# Patient Record
Sex: Male | Born: 1980 | Race: White | Hispanic: No | Marital: Married | Smoking: Current some day smoker
Health system: Southern US, Community
[De-identification: ages and names within clinical notes are randomized; demographics above are authoritative.]

## PROBLEM LIST (undated history)

## (undated) DIAGNOSIS — E119 Type 2 diabetes mellitus without complications: Secondary | ICD-10-CM

## (undated) DIAGNOSIS — J45909 Unspecified asthma, uncomplicated: Secondary | ICD-10-CM

## (undated) DIAGNOSIS — E785 Hyperlipidemia, unspecified: Secondary | ICD-10-CM

## (undated) HISTORY — DX: Hyperlipidemia, unspecified: E78.5

## (undated) HISTORY — DX: Type 2 diabetes mellitus without complications: E11.9

---
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
## 2007-03-27 ENCOUNTER — Ambulatory Visit (HOSPITAL_COMMUNITY): Admission: RE | Admit: 2007-03-27 | Discharge: 2007-03-27 | Payer: Self-pay | Admitting: Family Medicine

## 2010-12-26 ENCOUNTER — Emergency Department (HOSPITAL_COMMUNITY)
Admission: EM | Admit: 2010-12-26 | Discharge: 2010-12-26 | Disposition: A | Discharge status: Home / Self Care | Payer: Self-pay | Attending: Emergency Medicine | Admitting: Emergency Medicine

## 2010-12-26 DIAGNOSIS — J45909 Unspecified asthma, uncomplicated: Secondary | ICD-10-CM | POA: Insufficient documentation

## 2016-01-08 ENCOUNTER — Encounter (HOSPITAL_COMMUNITY): Payer: Self-pay | Admitting: Emergency Medicine

## 2016-01-08 ENCOUNTER — Emergency Department (HOSPITAL_COMMUNITY): Payer: BLUE CROSS/BLUE SHIELD

## 2016-01-08 ENCOUNTER — Emergency Department (HOSPITAL_COMMUNITY)
Admission: EM | Admit: 2016-01-08 | Discharge: 2016-01-08 | Disposition: A | Discharge status: Home / Self Care | Payer: BLUE CROSS/BLUE SHIELD | Attending: Emergency Medicine | Admitting: Emergency Medicine

## 2016-01-08 DIAGNOSIS — Z6841 Body Mass Index (BMI) 40.0 and over, adult: Secondary | ICD-10-CM | POA: Insufficient documentation

## 2016-01-08 DIAGNOSIS — Y929 Unspecified place or not applicable: Secondary | ICD-10-CM | POA: Diagnosis not present

## 2016-01-08 DIAGNOSIS — J45909 Unspecified asthma, uncomplicated: Secondary | ICD-10-CM | POA: Diagnosis not present

## 2016-01-08 DIAGNOSIS — S20221A Contusion of right back wall of thorax, initial encounter: Secondary | ICD-10-CM | POA: Insufficient documentation

## 2016-01-08 DIAGNOSIS — Y939 Activity, unspecified: Secondary | ICD-10-CM | POA: Diagnosis not present

## 2016-01-08 DIAGNOSIS — W108XXA Fall (on) (from) other stairs and steps, initial encounter: Secondary | ICD-10-CM | POA: Diagnosis not present

## 2016-01-08 DIAGNOSIS — Z79899 Other long term (current) drug therapy: Secondary | ICD-10-CM | POA: Diagnosis not present

## 2016-01-08 DIAGNOSIS — Y999 Unspecified external cause status: Secondary | ICD-10-CM | POA: Insufficient documentation

## 2016-01-08 DIAGNOSIS — F17200 Nicotine dependence, unspecified, uncomplicated: Secondary | ICD-10-CM | POA: Diagnosis not present

## 2016-01-08 DIAGNOSIS — S299XXA Unspecified injury of thorax, initial encounter: Secondary | ICD-10-CM | POA: Diagnosis present

## 2016-01-08 DIAGNOSIS — S20211A Contusion of right front wall of thorax, initial encounter: Secondary | ICD-10-CM

## 2016-01-08 HISTORY — DX: Unspecified asthma, uncomplicated: J45.909

## 2016-01-08 MED ORDER — DICLOFENAC SODIUM 50 MG PO TBEC: 50.0000 mg | DELAYED_RELEASE_TABLET | Freq: Two times a day (BID) | ORAL | Status: DC

## 2016-01-08 MED ORDER — METHOCARBAMOL 500 MG PO TABS: 500.0000 mg | ORAL_TABLET | Freq: Four times a day (QID) | ORAL | Status: DC

## 2016-01-08 NOTE — ED Notes (Signed)
Pt c/o lower back and right middle back/rib pain after slipping and falling on steps at 1440 this afternoon.

## 2016-01-08 NOTE — Discharge Instructions (Signed)
Chest Contusion A chest contusion is a deep bruise on your chest area. Contusions are the result of an injury that caused bleeding under the skin. A chest contusion may involve bruising of the skin, muscles, or ribs. The contusion may turn blue, purple, or yellow. Minor injuries will give you a painless contusion, but more severe contusions may stay painful and swollen for a few weeks. CAUSES  A contusion is usually caused by a blow, trauma, or direct force to an area of the body. SYMPTOMS   Swelling and redness of the injured area.  Discoloration of the injured area.  Tenderness and soreness of the injured area.  Pain. DIAGNOSIS  The diagnosis can be made by taking a history and performing a physical exam. An X-ray, CT scan, or MRI may be needed to determine if there were any associated injuries, such as broken bones (fractures) or internal injuries. TREATMENT  Often, the best treatment for a chest contusion is resting, icing, and applying cold compresses to the injured area. Deep breathing exercises may be recommended to reduce the risk of pneumonia. Over-the-counter medicines may also be recommended for pain control. HOME CARE INSTRUCTIONS   Put ice on the injured area.  Put ice in a plastic bag.  Place a towel between your skin and the bag.  Leave the ice on for 15-20 minutes, 03-04 times a day.  Only take over-the-counter or prescription medicines as directed by your caregiver. Your caregiver may recommend avoiding anti-inflammatory medicines (aspirin, ibuprofen, and naproxen) for 48 hours because these medicines may increase bruising.  Rest the injured area.  Perform deep-breathing exercises as directed by your caregiver.  Stop smoking if you smoke.  Do not lift objects over 5 pounds (2.3 kg) for 3 days or longer if recommended by your caregiver. SEEK IMMEDIATE MEDICAL CARE IF:   You have increased bruising or swelling.  You have pain that is getting worse.  You have  difficulty breathing.  You have dizziness, weakness, or fainting.  You have blood in your urine or stool.  You cough up or vomit blood.  Your swelling or pain is not relieved with medicines. MAKE SURE YOU:   Understand these instructions.  Will watch your condition.  Will get help right away if you are not doing well or get worse.   This information is not intended to replace advice given to you by your health care provider. Make sure you discuss any questions you have with your health care provider.   Document Released: 04/16/2001 Document Revised: 04/15/2012 Document Reviewed: 01/13/2012 Elsevier Interactive Patient Education 2016 [employer]  

## 2016-01-08 NOTE — ED Provider Notes (Signed)
History  By signing my name below, I, Earmon PhoenixJennifer Waddell, attest that this documentation has been prepared under the direction and in the presence of Langston MaskerKaren Sofia, [place]-C. Electronically Signed: Earmon PhoenixJennifer Waddell, ED Scribe. 01/08/2016. 6:23 PM.  Chief Complaint  Patient presents with  . Fall   The history is provided by the patient and medical records. No language interpreter was used.    HPI Comments:  Tony Terrell is a 35 y.o. morbidly obese male who presents to the Emergency Department complaining of a fall that occurred about three hours ago. Pt states he slipped and fell down two stairs, causing the last stair to crack. He reports some right sided lower back pain and right rib pain. He has not taken anything for pain. Movement and walking increase the pain. He denies alleviating factors. He denies head trauma, LOC, nausea, vomiting, abdominal pain, bruising or wounds. He is ambulatory without difficulty or assistance.  Past Medical History  Diagnosis Date  . Asthma    History reviewed. No pertinent past surgical history. No family history on file. Social History  Substance Use Topics  . Smoking status: Current Some Day Smoker  . Smokeless tobacco: None  . Alcohol Use: Yes     Comment: rare    Review of Systems  Musculoskeletal: Positive for back pain and arthralgias.  All other systems reviewed and are negative.   Allergies  Review of patient's allergies indicates no known allergies.  Home Medications   Prior to Admission medications   Medication Sig Start Date End Date Taking? Authorizing Provider  albuterol (PROVENTIL HFA;VENTOLIN HFA) 108 (90 Base) MCG/ACT inhaler Inhale 1 puff into the lungs every 6 (six) hours as needed for wheezing or shortness of breath.   Yes Historical Provider, MD   Triage Vitals: BP 133/65 mmHg  Pulse 98  Temp(Src) 98.9 F (37.2 C)  Resp 18  Ht 6\' 1"  (1.854 m)  Wt 355 lb (161.027 kg)  BMI 46.85 kg/m2  SpO2 98% Physical Exam   Constitutional: He is oriented to person, place, and time. He appears well-developed and well-nourished.  HENT:  Head: Normocephalic and atraumatic.  Eyes: EOM are normal.  Neck: Normal range of motion.  Cardiovascular: Normal rate.   Pulmonary/Chest: Effort normal.  Musculoskeletal: Normal range of motion. He exhibits tenderness.  Tender to L-3, L-4 and L-5 region. Tender to anterior right ribs.  Neurological: He is alert and oriented to person, place, and time.  Skin: Skin is warm and dry.  Psychiatric: He has a normal mood and affect. His behavior is normal.  Nursing note and vitals reviewed.   ED Course  Procedures (including critical care time) DIAGNOSTIC STUDIES: Oxygen Saturation is 98% on RA, normal by my interpretation.   COORDINATION OF CARE: 5:47 PM- Will order CXR and L-Spine. Offered pain medication but pt declined at this time. Pt verbalizes understanding and agrees to plan.  Medications - No data to display  Labs Review Labs Reviewed - No data to display  Imaging Review Dg Chest 2 View  01/08/2016  CLINICAL DATA:  Fall down stairs with chest pain and shortness of breath, initial encounter EXAM: CHEST  2 VIEW COMPARISON:  03/27/2007 FINDINGS: The heart size and mediastinal contours are within normal limits. Both lungs are clear. The visualized skeletal structures are unremarkable. IMPRESSION: No active cardiopulmonary disease. Electronically Signed   By: Alcide CleverMark  Lukens M.D.   On: 01/08/2016 18:06   Dg Lumbar Spine Complete  01/08/2016  CLINICAL DATA:  Fall down stairs  with right low back pain. EXAM: LUMBAR SPINE - COMPLETE 4+ VIEW COMPARISON:  None. FINDINGS: This report assumes 5 non rib-bearing lumbar vertebrae. Lumbar vertebral body heights are preserved, with no fracture. Lumbar disc heights are preserved. No spondylosis. No spondylolisthesis. No appreciable facet arthropathy. No aggressive appearing focal osseous lesions. IMPRESSION: No lumbar spine fracture or  spondylolisthesis. Electronically Signed   By: Delbert Phenix M.D.   On: 01/08/2016 18:06   I have personally reviewed and evaluated these images and lab results as part of my medical decision-making.   EKG Interpretation None      MDM   Final diagnoses:  Contusion of ribs, right, initial encounter  Contusion, back, right, initial encounter    Meds ordered this encounter  Medications  . albuterol (PROVENTIL HFA;VENTOLIN HFA) 108 (90 Base) MCG/ACT inhaler    Sig: Inhale 1 puff into the lungs every 6 (six) hours as needed for wheezing or shortness of breath.  . methocarbamol (ROBAXIN) 500 MG tablet    Sig: Take 1 tablet (500 mg total) by mouth 4 (four) times daily.    Dispense:  28 tablet    Refill:  0    Order Specific Question:  Supervising Provider    Answer:  MILLER, BRIAN [[id number]]  . diclofenac (VOLTAREN) 50 MG EC tablet    Sig: Take 1 tablet (50 mg total) by mouth 2 (two) times daily.    Dispense:  14 tablet    Refill:  0    Order Specific Question:  Supervising Provider    Answer:  Eber Hong [[id number]]   I personally performed the services in this documentation, which was scribed in my presence.  The recorded information has been reviewed and considered.   Barnet Pall.   Lonia Skinner [place], PA-C 01/08/16 2025  Bethann Berkshire, MD 01/11/16 [phone]

## 2017-04-11 IMAGING — DX DG CHEST 2V
2 series · 2 of 2 positions shown · non-contrast
Comparison: 03/27/2007

CLINICAL DATA: Fall down stairs with chest pain and shortness of
breath, initial encounter

EXAM:
CHEST  2 VIEW

[chest pa]
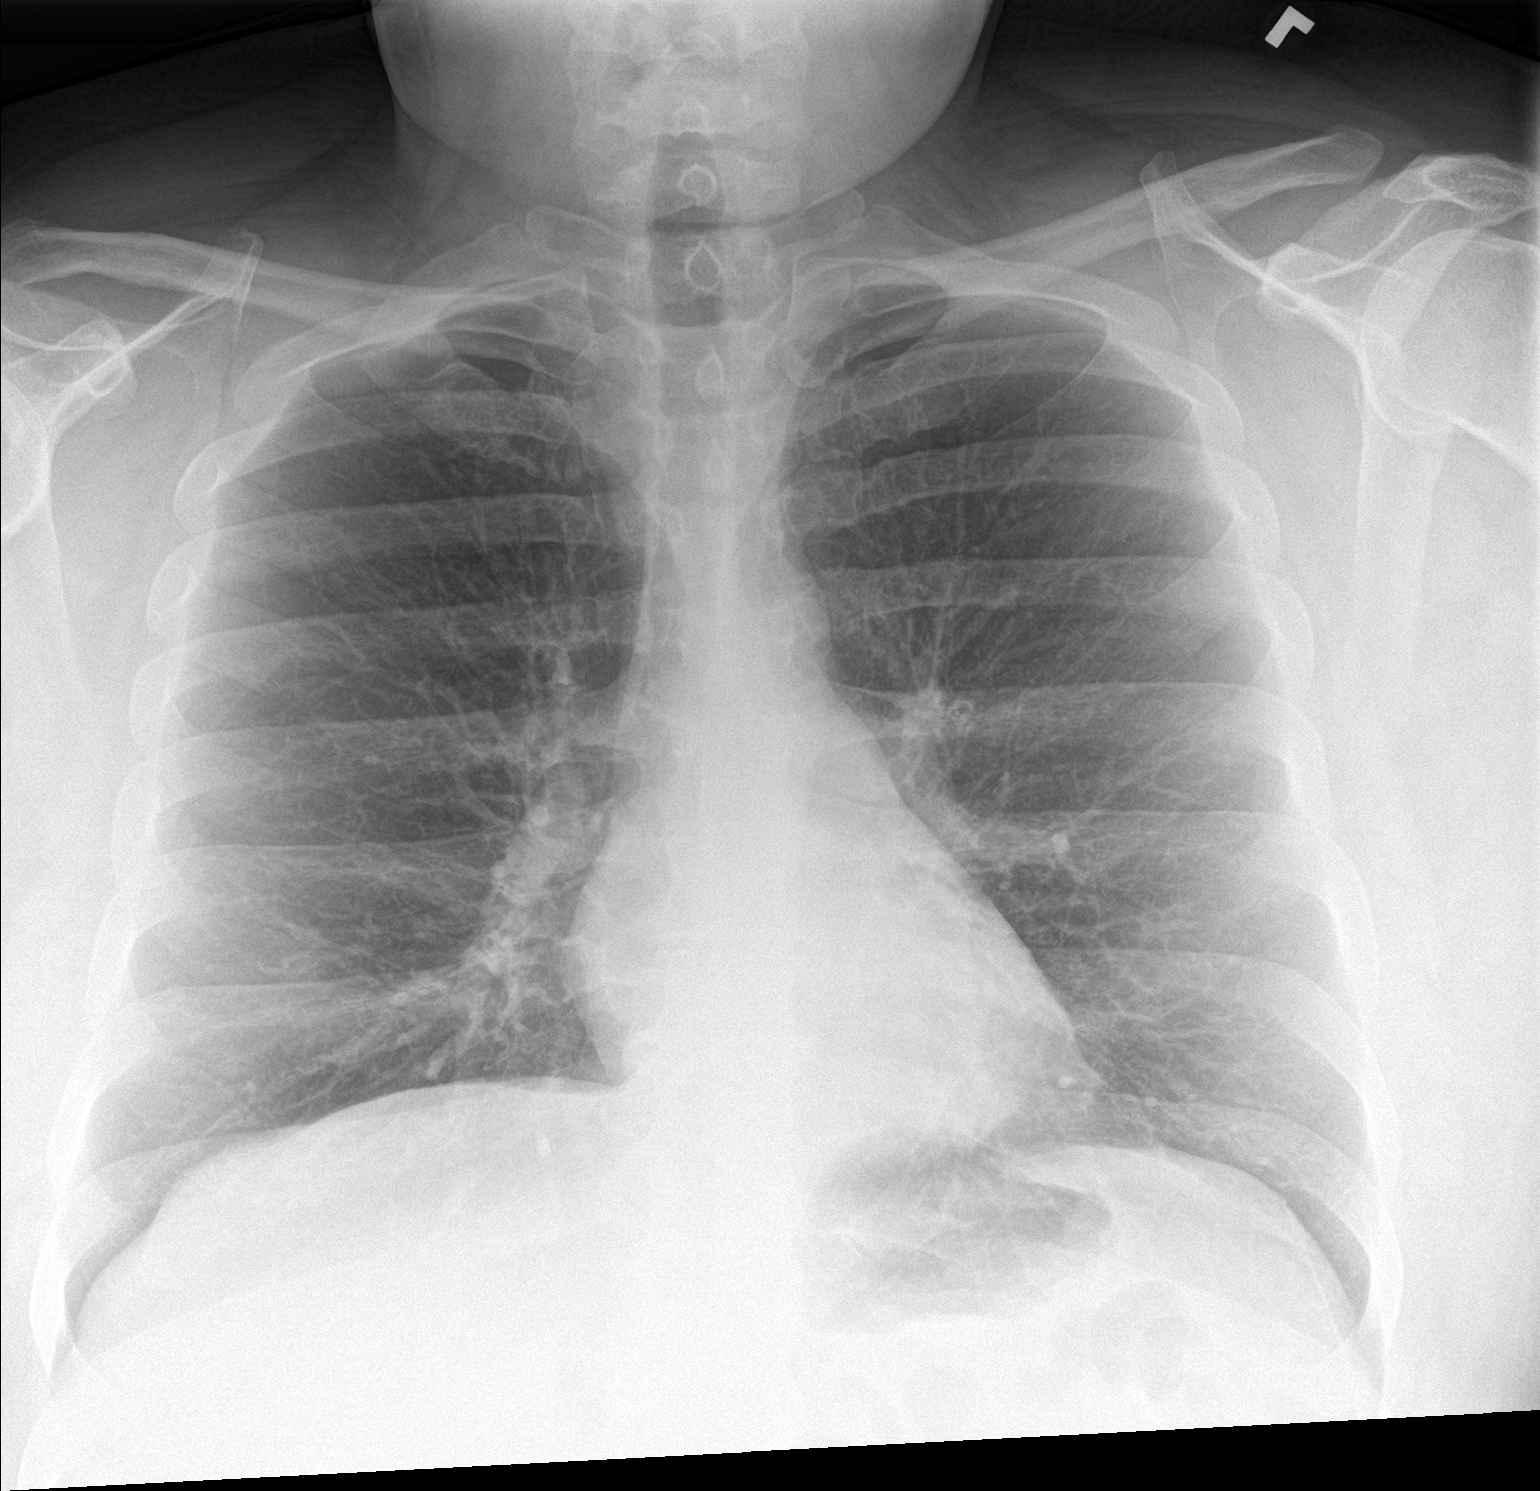

[chest lat]
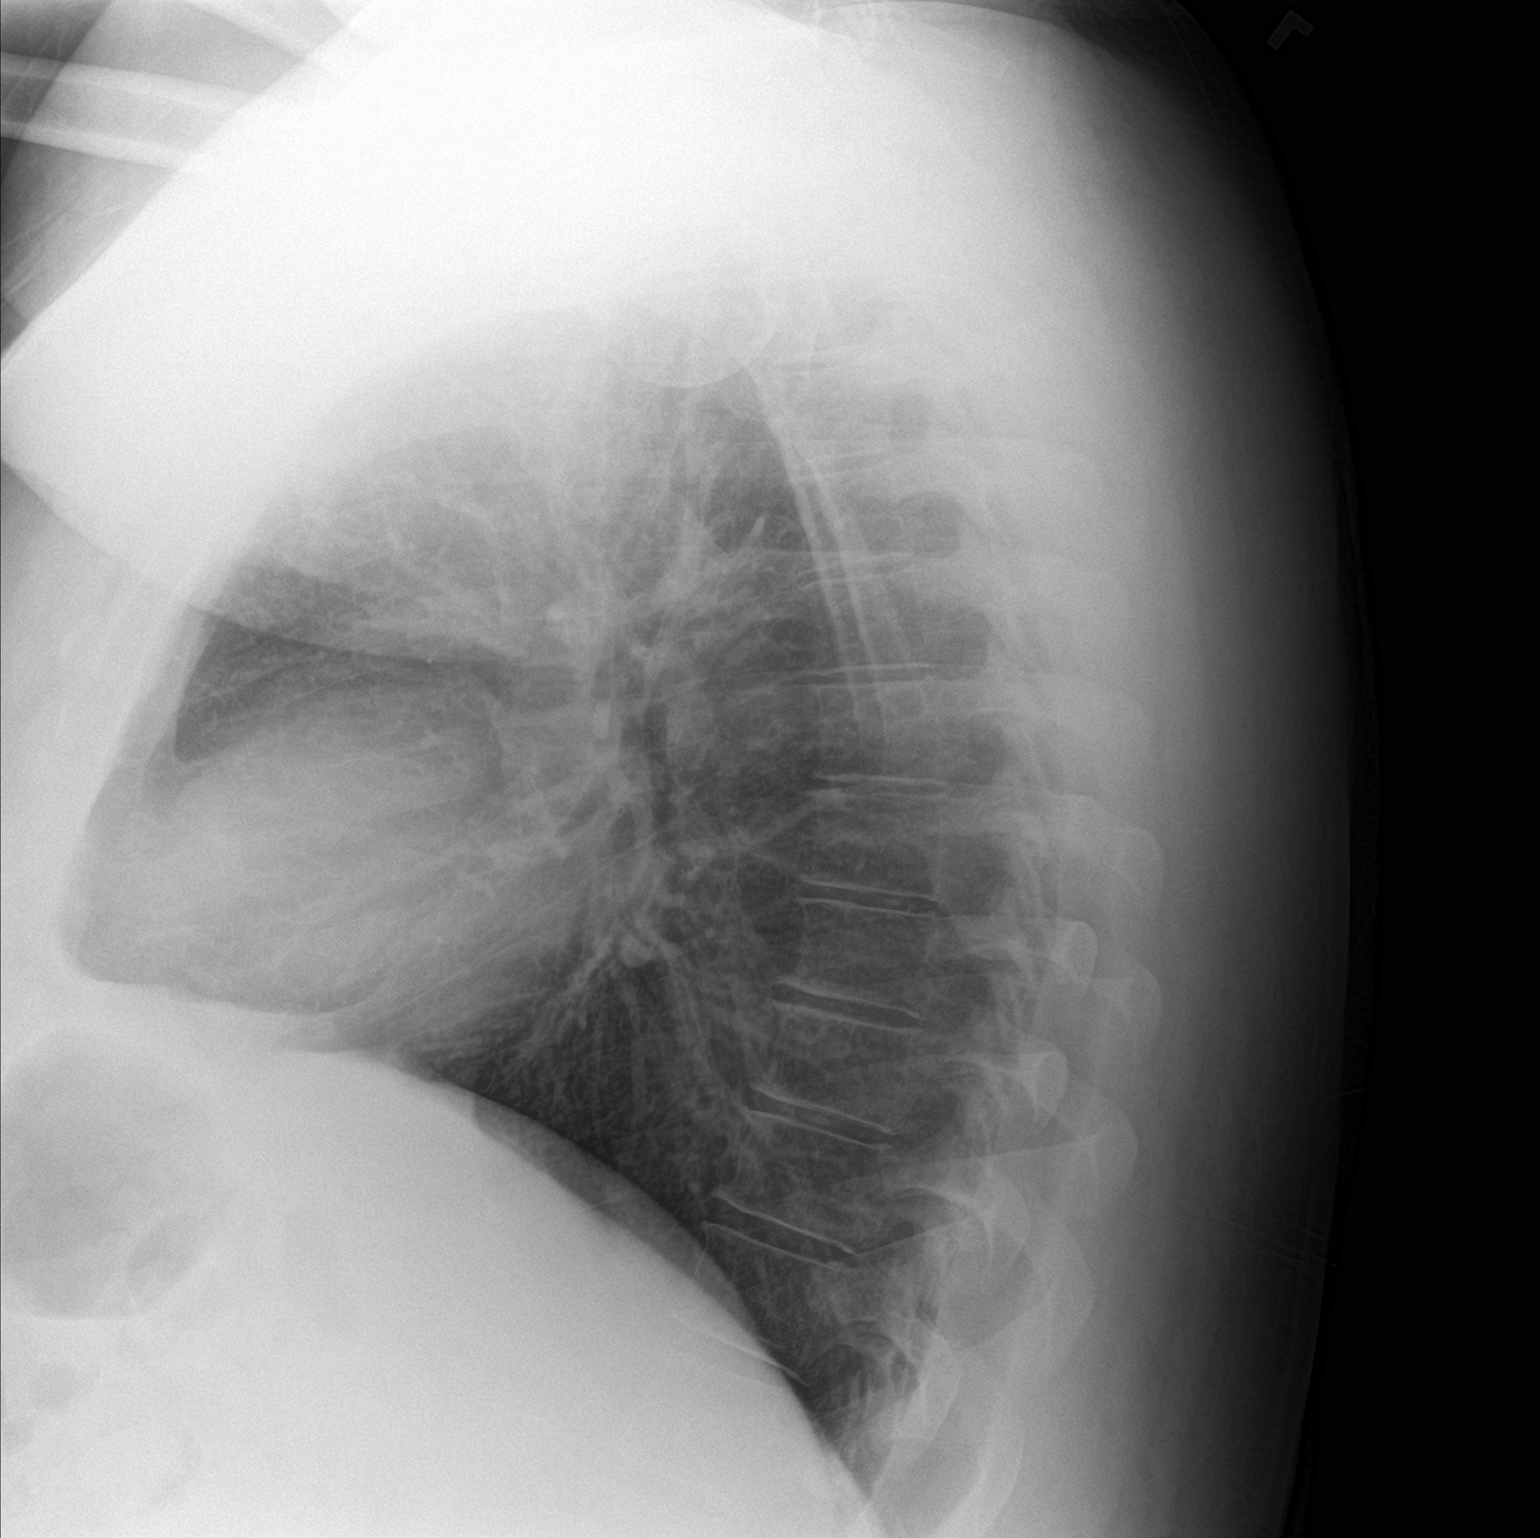

[2 of 2 positions shown; findings below may reference images not displayed]

FINDINGS: The heart size and mediastinal contours are within normal limits.
Both lungs are clear. The visualized skeletal structures are
unremarkable.
IMPRESSION: No active cardiopulmonary disease.

## 2021-07-17 ENCOUNTER — Ambulatory Visit: Payer: BLUE CROSS/BLUE SHIELD | Admitting: Nurse Practitioner

## 2021-07-31 ENCOUNTER — Ambulatory Visit: Payer: BLUE CROSS/BLUE SHIELD | Admitting: Nurse Practitioner

## 2021-08-06 ENCOUNTER — Other Ambulatory Visit: Payer: Self-pay

## 2021-08-06 ENCOUNTER — Encounter: Payer: Self-pay | Admitting: Nurse Practitioner

## 2021-08-06 ENCOUNTER — Ambulatory Visit: Payer: 59 | Admitting: Nurse Practitioner

## 2021-08-06 VITALS — BP 121/82 | HR 93 | Ht 72.0 in | Wt 348.0 lb

## 2021-08-06 DIAGNOSIS — N529 Male erectile dysfunction, unspecified: Secondary | ICD-10-CM

## 2021-08-06 DIAGNOSIS — Z139 Encounter for screening, unspecified: Secondary | ICD-10-CM | POA: Diagnosis not present

## 2021-08-06 DIAGNOSIS — J4520 Mild intermittent asthma, uncomplicated: Secondary | ICD-10-CM | POA: Diagnosis not present

## 2021-08-06 DIAGNOSIS — L659 Nonscarring hair loss, unspecified: Secondary | ICD-10-CM | POA: Insufficient documentation

## 2021-08-06 DIAGNOSIS — J45909 Unspecified asthma, uncomplicated: Secondary | ICD-10-CM | POA: Insufficient documentation

## 2021-08-06 DIAGNOSIS — F17200 Nicotine dependence, unspecified, uncomplicated: Secondary | ICD-10-CM | POA: Diagnosis not present

## 2021-08-06 DIAGNOSIS — H5789 Other specified disorders of eye and adnexa: Secondary | ICD-10-CM

## 2021-08-06 MED ORDER — ALBUTEROL SULFATE HFA 108 (90 BASE) MCG/ACT IN AERS: 1.0000 | INHALATION_SPRAY | Freq: Four times a day (QID) | RESPIRATORY_TRACT | 3 refills | Status: DC | PRN

## 2021-08-06 MED ORDER — SILDENAFIL CITRATE 100 MG PO TABS: 50.0000 mg | ORAL_TABLET | Freq: Every day | ORAL | 11 refills | Status: DC | PRN

## 2021-08-06 NOTE — Progress Notes (Signed)
New Patient Office Visit  Subjective:  Patient ID: Tony Terrell, male    DOB: 03/09/1981  Age: 41 y.o. MRN: [id number]  CC:  Chief Complaint  Patient presents with   New Patient (Initial Visit)    New pt. Has not really ever had a PCP - Would like to discuss scaling of left hand, ED, low back pain, Hair loss x several years, Constant watery eyes    HPI Tony Terrell presents to establish care. He has never had a PCP, will order routine labs today and bring pt back for annual exam in 4 weeks.   Has asthma , uses albuterol inhaler as needed.   Pt refused all required vaccinations, education provided today, he verbalized understanding.   Pt c/o of chronic erectile dysfunction, used cialis in the past but  it did not help.  Pt c/o of constant watery eyes uses prescription glasses, Goes to my eye doctor in [place], last eye exam was in November, they told him his exam was normal. He is using OTC eye drops from [facility], meds helps a little it.  Past Medical History:  Diagnosis Date   Asthma     History reviewed. No pertinent surgical history.  History reviewed. No pertinent family history.  Social History   Socioeconomic History   Marital status: Married    Spouse name: Not on file   Number of children: Not on file   Years of education: Not on file   Highest education level: Not on file  Occupational History   Not on file  Tobacco Use   Smoking status: Some Days   Smokeless tobacco: Not on file  Substance and Sexual Activity   Alcohol use: Yes    Comment: rare   Drug use: No   Sexual activity: Not on file  Other Topics Concern   Not on file  Social History Narrative   Not on file   Social Determinants of Health   Financial Resource Strain: Not on file  Food Insecurity: Not on file  Transportation Needs: Not on file  Physical Activity: Not on file  Stress: Not on file  Social Connections: Not on file  Intimate Partner Violence: Not on file     ROS Review of Systems  Constitutional: Negative.   HENT:         C/o bilateral chronic eye drainage   Respiratory:  Positive for wheezing.   Cardiovascular: Negative.   Gastrointestinal: Negative.   Endocrine: Negative.   Psychiatric/Behavioral: Negative.     Objective:   Today's Vitals: BP 121/82 (BP Location: Right Arm, Patient Position: Sitting, Cuff Size: Large)    Pulse 93    Ht 6' (1.829 m)    Wt (!) 348 lb (157.9 kg)    SpO2 94%    BMI 47.20 kg/m   Physical Exam Constitutional:      Appearance: He is obese. He is not ill-appearing, toxic-appearing or diaphoretic.  Eyes:     General: No scleral icterus.       Right eye: Discharge present.        Left eye: Discharge present.    Extraocular Movements: Extraocular movements intact.     Conjunctiva/sclera: Conjunctivae normal.     Comments: Small mucousy drainage noted on both eyes.   Cardiovascular:     Rate and Rhythm: Normal rate and regular rhythm.     Pulses: Normal pulses.     Heart sounds: Normal heart sounds. No murmur heard.   No friction  rub. No gallop.  Pulmonary:     Effort: No respiratory distress.     Breath sounds: No stridor. No wheezing, rhonchi or rales.  Chest:     Chest wall: No tenderness.  Abdominal:     Palpations: Abdomen is soft.  Psychiatric:        Mood and Affect: Mood normal.        Behavior: Behavior normal.        Thought Content: Thought content normal.        Judgment: Judgment normal.    Assessment & Plan:   Problem List Items Addressed This Visit   None   Outpatient Encounter Medications as of 08/06/2021  Medication Sig   albuterol (PROVENTIL HFA;VENTOLIN HFA) 108 (90 Base) MCG/ACT inhaler Inhale 1 puff into the lungs every 6 (six) hours as needed for wheezing or shortness of breath.   [DISCONTINUED] diclofenac (VOLTAREN) 50 MG EC tablet Take 1 tablet (50 mg total) by mouth 2 (two) times daily.   [DISCONTINUED] methocarbamol (ROBAXIN) 500 MG tablet Take 1 tablet (500 mg  total) by mouth 4 (four) times daily.   No facility-administered encounter medications on file as of 08/06/2021.    Follow-up: No follow-ups on file.   Renee Rival, FNP

## 2021-08-06 NOTE — Assessment & Plan Note (Signed)
Check TSH, level Will refer to dermatology at next visit.

## 2021-08-06 NOTE — Assessment & Plan Note (Signed)
recently had his eye exam done. Exam was normal per pt.Denies eye pain Continue otc eye drops.

## 2021-08-06 NOTE — Assessment & Plan Note (Signed)
Condition well controlled. Refilled albuterol. Pt advised to stop smoking.

## 2021-08-06 NOTE — Assessment & Plan Note (Signed)
Start taking viagra as needed.  Refer to urology if med not effective.

## 2021-08-06 NOTE — Assessment & Plan Note (Signed)
Smoking cessation materia given, not ready to quit at this time.

## 2021-08-06 NOTE — Patient Instructions (Addendum)
Please get your labs done 3-5 days before your next visit.   Take viagra 50 mg daily as needed for erectile dysfunction.   It is important that you exercise regularly at least 30 minutes 5 times a week.  Think about what you will eat, plan ahead. Choose " clean, green, fresh or frozen" over canned, processed or packaged foods which are more sugary, salty and fatty. 70 to 75% of food eaten should be vegetables and fruit. Three meals at set times with snacks allowed between meals, but they must be fruit or vegetables. Aim to eat over a 12 hour period , example 7 am to 7 pm, and STOP after  your last meal of the day. Drink water,generally about 64 ounces per day, no other drink is as healthy. Fruit juice is best enjoyed in a healthy way, by EATING the fruit.  Thanks for choosing [facility], we consider it a privelige to serve you.

## 2021-08-06 NOTE — Assessment & Plan Note (Signed)
Importance of healthy food choices with portion control discussed as well as eating regularly within 12  hour window.   The need to choose clean green food 50%-75% of time is discussed as well as make water the primary drink and set a goal for 64 ounces daily.  Patient reeducated about the importance of committment to minimum of 150 minutes of exercise per week.  Three meals at set times with snacks allowed between meals but they must be fruit or vegetable.   Aim to eat  over 12 hour period  for example 7 am to 7 pm. Stop after your last meal of the day.  Wt Readings from Last 3 Encounters:  08/06/21 (!) 348 lb (157.9 kg)  01/08/16 (!) 355 lb (161 kg)

## 2021-08-31 ENCOUNTER — Other Ambulatory Visit: Payer: Self-pay | Admitting: Nurse Practitioner

## 2021-08-31 DIAGNOSIS — E119 Type 2 diabetes mellitus without complications: Secondary | ICD-10-CM

## 2021-08-31 DIAGNOSIS — E1169 Type 2 diabetes mellitus with other specified complication: Secondary | ICD-10-CM

## 2021-08-31 DIAGNOSIS — E559 Vitamin D deficiency, unspecified: Secondary | ICD-10-CM

## 2021-08-31 LAB — CBC
Hematocrit: 39.1 % (ref 37.5–51.0)
Hemoglobin: 13.3 g/dL (ref 13.0–17.7)
MCH: 31.4 pg (ref 26.6–33.0)
MCHC: 34 g/dL (ref 31.5–35.7)
MCV: 92 fL (ref 79–97)
Platelets: 356 10*3/uL (ref 150–450)
RBC: 4.23 x10E6/uL (ref 4.14–5.80)
RDW: 12.4 % (ref 11.6–15.4)
WBC: 9.9 10*3/uL (ref 3.4–10.8)

## 2021-08-31 LAB — CMP14+EGFR
ALT: 21 IU/L (ref 0–44)
AST: 15 IU/L (ref 0–40)
Albumin/Globulin Ratio: 1.8 (ref 1.2–2.2)
Albumin: 4.2 g/dL (ref 4.0–5.0)
Alkaline Phosphatase: 68 IU/L (ref 44–121)
BUN/Creatinine Ratio: 9 (ref 9–20)
BUN: 7 mg/dL (ref 6–24)
Bilirubin Total: 0.5 mg/dL (ref 0.0–1.2)
CO2: 23 mmol/L (ref 20–29)
Calcium: 9 mg/dL (ref 8.7–10.2)
Chloride: 104 mmol/L (ref 96–106)
Creatinine, Ser: 0.77 mg/dL (ref 0.76–1.27)
Globulin, Total: 2.4 g/dL (ref 1.5–4.5)
Glucose: 107 mg/dL — ABNORMAL HIGH (ref 70–99)
Potassium: 4.4 mmol/L (ref 3.5–5.2)
Sodium: 140 mmol/L (ref 134–144)
Total Protein: 6.6 g/dL (ref 6.0–8.5)
eGFR: 115 mL/min/{1.73_m2} (ref >59)

## 2021-08-31 LAB — LIPID PANEL
Chol/HDL Ratio: 6.9 ratio — ABNORMAL HIGH (ref 0.0–5.0)
Cholesterol, Total: 179 mg/dL (ref 100–199)
HDL: 26 mg/dL — ABNORMAL LOW (ref >39)
LDL Chol Calc (NIH): 132 mg/dL — ABNORMAL HIGH (ref 0–99)
Triglycerides: 113 mg/dL (ref 0–149)
VLDL Cholesterol Cal: 21 mg/dL (ref 5–40)

## 2021-08-31 LAB — TSH: TSH: 1.79 u[IU]/mL (ref 0.450–4.500)

## 2021-08-31 LAB — HEMOGLOBIN A1C
Est. average glucose Bld gHb Est-mCnc: 160 mg/dL
Hgb A1c MFr Bld: 7.2 % — ABNORMAL HIGH (ref 4.8–5.6)

## 2021-08-31 LAB — VITAMIN D 25 HYDROXY (VIT D DEFICIENCY, FRACTURES): Vit D, 25-Hydroxy: 7.1 ng/mL — ABNORMAL LOW (ref 30.0–100.0)

## 2021-08-31 MED ORDER — ATORVASTATIN CALCIUM 10 MG PO TABS: 10.0000 mg | ORAL_TABLET | Freq: Every day | ORAL | 3 refills | Status: DC

## 2021-08-31 MED ORDER — METFORMIN HCL 500 MG PO TABS: 500.0000 mg | ORAL_TABLET | Freq: Two times a day (BID) | ORAL | 3 refills | Status: DC

## 2021-08-31 MED ORDER — VITAMIN D (ERGOCALCIFEROL) 1.25 MG (50000 UNIT) PO CAPS: 50000.0000 [IU] | ORAL_CAPSULE | ORAL | 0 refills | Status: DC

## 2021-08-31 NOTE — Progress Notes (Signed)
Please review results with patient. Vitamin D level is low he should start taking vitamin D 50,000 units once weekly for 8 weeks.  Patient is diabetic he should start taking metformin 500 mg 2 times daily, avoid soda.  Patient should start taking atorvastatin 10 mg daily for his high cholesterol.  Follow-up as planned.

## 2021-09-03 ENCOUNTER — Encounter: Payer: Self-pay | Admitting: Nurse Practitioner

## 2021-09-03 ENCOUNTER — Other Ambulatory Visit: Payer: Self-pay

## 2021-09-03 ENCOUNTER — Ambulatory Visit: Payer: 59 | Admitting: Nurse Practitioner

## 2021-09-03 VITALS — BP 136/88 | HR 88 | Ht 72.0 in | Wt 346.1 lb

## 2021-09-03 DIAGNOSIS — E1169 Type 2 diabetes mellitus with other specified complication: Secondary | ICD-10-CM

## 2021-09-03 DIAGNOSIS — N529 Male erectile dysfunction, unspecified: Secondary | ICD-10-CM

## 2021-09-03 DIAGNOSIS — E559 Vitamin D deficiency, unspecified: Secondary | ICD-10-CM | POA: Diagnosis not present

## 2021-09-03 DIAGNOSIS — E785 Hyperlipidemia, unspecified: Secondary | ICD-10-CM

## 2021-09-03 DIAGNOSIS — Z139 Encounter for screening, unspecified: Secondary | ICD-10-CM

## 2021-09-03 DIAGNOSIS — R030 Elevated blood-pressure reading, without diagnosis of hypertension: Secondary | ICD-10-CM

## 2021-09-03 DIAGNOSIS — L659 Nonscarring hair loss, unspecified: Secondary | ICD-10-CM

## 2021-09-03 DIAGNOSIS — E119 Type 2 diabetes mellitus without complications: Secondary | ICD-10-CM

## 2021-09-03 NOTE — Assessment & Plan Note (Signed)
Pt refered to dermatology

## 2021-09-03 NOTE — Assessment & Plan Note (Signed)
Lab Results  Component Value Date   CHOL 179 08/30/2021   HDL 26 (L) 08/30/2021   LDLCALC 132 (H) 08/30/2021   TRIG 113 08/30/2021   CHOLHDL 6.9 (H) 08/30/2021   Patient educated on the need for cholesterol-lowering agents, and the risk of getting stroke and heart attack if his cholesterol is not treated, he  refused medication, he states that he is going to make lifestyle modifications. I told the pt that making lifestyle modifications might not be enough but he still refused meds.

## 2021-09-03 NOTE — Patient Instructions (Addendum)
You are encouraged to get your Tdap vaccine and covid booster at your pharmacy. Please get your labs done 3 to 5 days before your next appointment  It is important that you exercise regularly at least 30 minutes 5 times a week.  Think about what you will eat, plan ahead. Choose " clean, green, fresh or frozen" over canned, processed or packaged foods which are more sugary, salty and fatty. 70 to 75% of food eaten should be vegetables and fruit. Three meals at set times with snacks allowed between meals, but they must be fruit or vegetables. Aim to eat over a 12 hour period , example 7 am to 7 pm, and STOP after  your last meal of the day. Drink water,generally about 64 ounces per day, no other drink is as healthy. Fruit juice is best enjoyed in a healthy way, by EATING the fruit.  Thanks for choosing [facility], we consider it a privelige to serve you.

## 2021-09-03 NOTE — Assessment & Plan Note (Signed)
DASH diet and commitment to daily physical activity for a minimum of 30 minutes discussed and encouraged, as a part of hypertension management. The importance of attaining a healthy weight is also discussed.  BP/Weight 09/03/2021 08/06/2021 01/08/2016  Systolic BP 136 121 133  Diastolic BP 88 82 65  Wt. (Lbs) 346.08 348 355  BMI 46.94 47.2 46.85   Pt refused to start any medication today.

## 2021-09-03 NOTE — Assessment & Plan Note (Signed)
Patient stated that he has only used medication 1 time because he has been  so busy. Unable to determine if medication worked well for him or not.

## 2021-09-03 NOTE — Progress Notes (Signed)
° °  Tony Terrell     MRN: [id number]      DOB: Oct 02, 1980   HPI Tony Terrell is here for follow up and re-evaluation of chronic medical conditions,and review of any available recent lab and radiology data.     ROS Denies recent fever or chills. Denies sinus pressure, nasal congestion, ear pain or sore throat. Denies chest congestion, productive cough or wheezing. Denies chest pains, palpitations and leg swelling Denies abdominal pain, nausea, vomiting,diarrhea or constipation.   Denies dysuria, frequency, hesitancy or incontinence. Denies joint pain, swelling and limitation in mobility. Denies headaches, seizures, numbness, or tingling. Denies depression, anxiety or insomnia. Denies skin break down or rash, has chronic hair loss   PE  BP 136/88 (BP Location: Left Arm, Patient Position: Sitting, Cuff Size: Large)    Pulse 88    Ht 6' (1.829 m)    Wt (!) 346 lb 1.3 oz (157 kg)    SpO2 (!) 85%    BMI 46.94 kg/m   Patient alert and oriented and in no cardiopulmonary distress.  Chest: Clear to auscultation bilaterally.  CVS: S1, S2 no murmurs, no S3.Regular rate.  ABD: Soft non tender.   Ext: No edema  MS: Adequate ROM spine, shoulders, hips and knees.  Skin: Intact, no ulcerations or rash noted.  Psych: Good eye contact, normal affect. Memory intact not anxious or depressed appearing.     Assessment & Plan

## 2021-09-03 NOTE — Assessment & Plan Note (Signed)
Lab Results  Component Value Date   HGBA1C 7.2 (H) 08/30/2021   Patient educated on the need for DM medications , and the risk of getting stroke and heart attack if his DM is not treated, he  refused medication, he states that he is going to make lifestyle modifications. I told the pt that making lifestyle modifications might not be enough but he still refused meds. Pt advised to loose weight, avoid sugars, soda. engage in regular exercises 30 minutes 5 -7 days a week.

## 2021-09-03 NOTE — Assessment & Plan Note (Signed)
Patient refused medication order, he stated that he would buy over-the-counter vitamin D and be using that instead.  Will recheck labs at next visit

## 2021-09-03 NOTE — Assessment & Plan Note (Signed)
Wt Readings from Last 3 Encounters:  09/03/21 (!) 346 lb 1.3 oz (157 kg)  08/06/21 (!) 348 lb (157.9 kg)  01/08/16 (!) 355 lb (161 kg)  Importance of healthy food choices with portion control discussed as well as eating regularly within 12  hour window.   The need to choose clean green food 50%-75% of time is discussed as well as make water the primary drink and set a goal for 64 ounces daily.  Patient reeducated about the importance of committment to minimum of 150 minutes of exercise per week.  Three meals at set times with snacks allowed between meals but they must be fruit or vegetable.   Aim to eat  over 12 hour period  for example 7 am to 7 pm. Stop after your last meal of the day.

## 2021-12-04 ENCOUNTER — Ambulatory Visit: Payer: 59 | Admitting: Nurse Practitioner

## 2023-01-31 ENCOUNTER — Encounter: Payer: Self-pay | Admitting: Family Medicine

## 2023-01-31 ENCOUNTER — Ambulatory Visit: Payer: 59 | Admitting: Family Medicine

## 2023-01-31 VITALS — BP 138/83 | HR 81 | Ht 72.0 in | Wt 320.1 lb

## 2023-01-31 DIAGNOSIS — E119 Type 2 diabetes mellitus without complications: Secondary | ICD-10-CM

## 2023-01-31 DIAGNOSIS — E559 Vitamin D deficiency, unspecified: Secondary | ICD-10-CM | POA: Diagnosis not present

## 2023-01-31 DIAGNOSIS — L659 Nonscarring hair loss, unspecified: Secondary | ICD-10-CM | POA: Diagnosis not present

## 2023-01-31 DIAGNOSIS — Z114 Encounter for screening for human immunodeficiency virus [HIV]: Secondary | ICD-10-CM

## 2023-01-31 DIAGNOSIS — Z13220 Encounter for screening for lipoid disorders: Secondary | ICD-10-CM

## 2023-01-31 DIAGNOSIS — Z1329 Encounter for screening for other suspected endocrine disorder: Secondary | ICD-10-CM

## 2023-01-31 DIAGNOSIS — Z131 Encounter for screening for diabetes mellitus: Secondary | ICD-10-CM

## 2023-01-31 DIAGNOSIS — Z1159 Encounter for screening for other viral diseases: Secondary | ICD-10-CM

## 2023-01-31 DIAGNOSIS — M545 Low back pain, unspecified: Secondary | ICD-10-CM | POA: Diagnosis not present

## 2023-01-31 MED ORDER — CYCLOBENZAPRINE HCL 5 MG PO TABS: 5.0000 mg | ORAL_TABLET | Freq: Three times a day (TID) | ORAL | 1 refills | Status: AC | PRN

## 2023-01-31 NOTE — Progress Notes (Signed)
Patient Office Visit   Subjective   Patient ID: Tony Terrell, male    DOB: January 18, 1981  Age: 42 y.o. MRN: [id number]  CC:  Chief Complaint  Patient presents with   New Patient (Initial Visit)    Establishing care.    Back Pain    Pt reports low back from a fall in the past, fall was years ago 5-6.    HPI Tony Terrell 42 year old male, presents to establish care. He  has a past medical history of Asthma.  Back Pain This is a chronic problem. The problem occurs intermittently. The problem has been gradually worsening since onset. The pain is present in the lumbar spine. The quality of the pain is described as stabbing. The pain does not radiate. The pain is at a severity of 9/10. The pain is The same all the time. The symptoms are aggravated by lying down, position, bending, twisting, sitting and standing. Stiffness is present In the morning. Pertinent negatives include no abdominal pain, bladder incontinence, bowel incontinence, chest pain, dysuria, fever, headaches, leg pain, numbness, paresis or paresthesias. Risk factors include obesity and lack of exercise. He has tried NSAIDs for the symptoms. The treatment provided mild relief.      Outpatient Encounter Medications as of 01/31/2023  Medication Sig   albuterol (VENTOLIN HFA) 108 (90 Base) MCG/ACT inhaler Inhale 1 puff into the lungs every 6 (six) hours as needed for wheezing or shortness of breath.   cyclobenzaprine (FLEXERIL) 5 MG tablet Take 1 tablet (5 mg total) by mouth 3 (three) times daily as needed for muscle spasms.   [DISCONTINUED] sildenafil (VIAGRA) 100 MG tablet Take 0.5-1 tablets (50-100 mg total) by mouth daily as needed for erectile dysfunction.   atorvastatin (LIPITOR) 10 MG tablet Take 1 tablet (10 mg total) by mouth daily.   metFORMIN (GLUCOPHAGE) 500 MG tablet Take 1 tablet (500 mg total) by mouth 2 (two) times daily with a meal.   Vitamin D, Ergocalciferol, (DRISDOL) 1.25 MG (50000 UNIT) CAPS capsule Take 1  capsule (50,000 Units total) by mouth every 7 (seven) days.   No facility-administered encounter medications on file as of 01/31/2023.    Past Surgical History:  Procedure Laterality Date   HAND SURGERY Right 1992    Review of Systems  Constitutional:  Negative for chills and fever.  Eyes:  Negative for blurred vision.  Respiratory:  Negative for shortness of breath.   Cardiovascular:  Negative for chest pain.  Gastrointestinal:  Negative for abdominal pain and bowel incontinence.  Genitourinary:  Negative for bladder incontinence and dysuria.  Musculoskeletal:  Positive for back pain and myalgias.  Neurological:  Negative for dizziness, numbness, headaches and paresthesias.      Objective    BP 138/83   Pulse 81   Ht 6' (1.829 m)   Wt (!) 320 lb 1.9 oz (145.2 kg)   SpO2 95%   BMI 43.42 kg/m   Physical Exam Vitals reviewed.  Constitutional:      General: He is not in acute distress.    Appearance: Normal appearance. He is not ill-appearing, toxic-appearing or diaphoretic.  HENT:     Head: Normocephalic.  Eyes:     General:        Right eye: No discharge.        Left eye: No discharge.     Conjunctiva/sclera: Conjunctivae normal.  Cardiovascular:     Rate and Rhythm: Normal rate.     Pulses: Normal pulses.  Heart sounds: Normal heart sounds.  Pulmonary:     Effort: Pulmonary effort is normal. No respiratory distress.     Breath sounds: Normal breath sounds.  Abdominal:     General: Bowel sounds are normal.     Palpations: Abdomen is soft.     Tenderness: There is no abdominal tenderness. There is no right CVA tenderness, left CVA tenderness or guarding.  Musculoskeletal:        General: Normal range of motion.     Cervical back: Normal range of motion.  Skin:    General: Skin is warm and dry.     Capillary Refill: Capillary refill takes less than 2 seconds.  Neurological:     General: No focal deficit present.     Mental Status: He is alert and oriented  to person, place, and time.     Coordination: Coordination normal.     Gait: Gait normal.  Psychiatric:        Mood and Affect: Mood normal.        Behavior: Behavior normal.       Assessment & Plan:  Screening for diabetes mellitus -     Microalbumin / creatinine urine ratio -     Hemoglobin A1c  Screening for lipid disorders -     Lipid panel -     CMP14+EGFR -     CBC with Differential/Platelet  Screening for thyroid disorder -     TSH + free T4  Screening for HIV (human immunodeficiency virus) -     HIV Antibody (routine testing w rflx)  Need for hepatitis C screening test -     Hepatitis C antibody  Vitamin D deficiency -     VITAMIN D 25 Hydroxy (Vit-D Deficiency, Fractures)  Diabetic eye exam (HCC) -     Ambulatory referral to Ophthalmology  Lumbar pain Assessment & Plan: Trial Flexeril 5 mg PRN Discussed medication desired effects, potential side effects. Non pharmacological interventions include rest, avoid twisting, improper bending, straining lower back. Demonstration of proper body mechanics. Alternate ice and heat. Recommend stretching back and legs. Follow up for worsening or persistent symptoms. Patient verbalizes understanding regarding plan of care and all questions answered.   Orders: -     Cyclobenzaprine HCl; Take 1 tablet (5 mg total) by mouth 3 (three) times daily as needed for muscle spasms.  Dispense: 30 tablet; Refill: 1  Hair loss -     Ambulatory referral to Dermatology    Return in about 3 months (around 05/03/2023), or if symptoms worsen or fail to improve, for diabetes.   Cruzita Lederer Newman Nip, FNP

## 2023-01-31 NOTE — Assessment & Plan Note (Signed)
Trial Flexeril 5 mg PRN Discussed medication desired effects, potential side effects. Non pharmacological interventions include rest, avoid twisting, improper bending, straining lower back. Demonstration of proper body mechanics. Alternate ice and heat. Recommend stretching back and legs. Follow up for worsening or persistent symptoms. Patient verbalizes understanding regarding plan of care and all questions answered.

## 2023-01-31 NOTE — Patient Instructions (Signed)

## 2023-02-01 LAB — CBC WITH DIFFERENTIAL/PLATELET
Eos: 3 %
Hematocrit: 41.7 % (ref 37.5–51.0)
Hemoglobin: 14.5 g/dL (ref 13.0–17.7)
Immature Granulocytes: 0 %
MCH: 31.7 pg (ref 26.6–33.0)
MCHC: 34.8 g/dL (ref 31.5–35.7)
MCV: 91 fL (ref 79–97)
Monocytes Absolute: 0.7 10*3/uL (ref 0.1–0.9)
Monocytes: 7 %
Platelets: 356 10*3/uL (ref 150–450)
RBC: 4.57 x10E6/uL (ref 4.14–5.80)
RDW: 12.5 % (ref 11.6–15.4)

## 2023-02-01 LAB — CMP14+EGFR
ALT: 18 IU/L (ref 0–44)
BUN/Creatinine Ratio: 11 (ref 9–20)
Chloride: 104 mmol/L (ref 96–106)
Creatinine, Ser: 0.84 mg/dL (ref 0.76–1.27)
Globulin, Total: 2.5 g/dL (ref 1.5–4.5)
Glucose: 92 mg/dL (ref 70–99)
Sodium: 144 mmol/L (ref 134–144)
eGFR: 111 mL/min/{1.73_m2} (ref >59)

## 2023-02-01 LAB — MICROALBUMIN / CREATININE URINE RATIO

## 2023-02-01 LAB — TSH+FREE T4: Free T4: 1.07 ng/dL (ref 0.82–1.77)

## 2023-02-01 LAB — HEMOGLOBIN A1C
Est. average glucose Bld gHb Est-mCnc: 148 mg/dL
Hgb A1c MFr Bld: 6.8 % — ABNORMAL HIGH (ref 4.8–5.6)

## 2023-02-01 LAB — LIPID PANEL: HDL: 30 mg/dL — ABNORMAL LOW (ref >39)

## 2023-02-02 LAB — CMP14+EGFR
AST: 10 IU/L (ref 0–40)
Albumin: 4.2 g/dL (ref 4.1–5.1)
Alkaline Phosphatase: 82 IU/L (ref 44–121)
BUN: 9 mg/dL (ref 6–24)
Bilirubin Total: 0.4 mg/dL (ref 0.0–1.2)
CO2: 26 mmol/L (ref 20–29)
Calcium: 9.5 mg/dL (ref 8.7–10.2)
Potassium: 5.3 mmol/L — ABNORMAL HIGH (ref 3.5–5.2)
Total Protein: 6.7 g/dL (ref 6.0–8.5)

## 2023-02-02 LAB — CBC WITH DIFFERENTIAL/PLATELET
Basophils Absolute: 0.1 10*3/uL (ref 0.0–0.2)
Basos: 1 %
EOS (ABSOLUTE): 0.3 10*3/uL (ref 0.0–0.4)
Immature Grans (Abs): 0 10*3/uL (ref 0.0–0.1)
Lymphocytes Absolute: 2.9 10*3/uL (ref 0.7–3.1)
Lymphs: 30 %
Neutrophils Absolute: 5.6 10*3/uL (ref 1.4–7.0)
Neutrophils: 59 %
WBC: 9.6 10*3/uL (ref 3.4–10.8)

## 2023-02-02 LAB — LIPID PANEL
Chol/HDL Ratio: 6.5 ratio — ABNORMAL HIGH (ref 0.0–5.0)
Cholesterol, Total: 195 mg/dL (ref 100–199)
LDL Chol Calc (NIH): 138 mg/dL — ABNORMAL HIGH (ref 0–99)
Triglycerides: 150 mg/dL — ABNORMAL HIGH (ref 0–149)
VLDL Cholesterol Cal: 27 mg/dL (ref 5–40)

## 2023-02-02 LAB — TSH+FREE T4: TSH: 2.29 u[IU]/mL (ref 0.450–4.500)

## 2023-02-02 LAB — HIV ANTIBODY (ROUTINE TESTING W REFLEX): HIV Screen 4th Generation wRfx: NONREACTIVE

## 2023-02-02 LAB — HEPATITIS C ANTIBODY: Hep C Virus Ab: NONREACTIVE

## 2023-02-02 LAB — MICROALBUMIN / CREATININE URINE RATIO: Microalb/Creat Ratio: 3 mg/g creat (ref 0–29)

## 2023-02-02 LAB — VITAMIN D 25 HYDROXY (VIT D DEFICIENCY, FRACTURES): Vit D, 25-Hydroxy: 9.7 ng/mL — ABNORMAL LOW (ref 30.0–100.0)

## 2023-02-03 ENCOUNTER — Other Ambulatory Visit: Payer: Self-pay | Admitting: Family Medicine

## 2023-02-03 MED ORDER — ROSUVASTATIN CALCIUM 40 MG PO TABS: 40.0000 mg | ORAL_TABLET | Freq: Every day | ORAL | 1 refills | Status: DC

## 2023-02-03 MED ORDER — VITAMIN D3 50 MCG (2000 UT) PO CAPS: 2000.0000 [IU] | ORAL_CAPSULE | Freq: Every day | ORAL | 1 refills | Status: AC

## 2023-02-03 MED ORDER — OZEMPIC (0.25 OR 0.5 MG/DOSE) 2 MG/3ML ~~LOC~~ SOPN: 0.5000 mg | PEN_INJECTOR | SUBCUTANEOUS | 0 refills | Status: DC

## 2023-02-17 ENCOUNTER — Telehealth: Payer: Self-pay | Admitting: Family Medicine

## 2023-02-17 NOTE — Telephone Encounter (Signed)
Patient called just seen provider last week and medication Needs copies of blood work results with this medication Semaglutide,0.25 or 0.5MG /DOS, (OZEMPIC, 0.25 OR 0.5 MG/DOSE,) 2 MG/3ML SOPN to have it approved Pharmacy: [facility]

## 2023-02-17 NOTE — Telephone Encounter (Signed)
Spoke with patient. Lab results sent to [facility].

## 2023-03-13 ENCOUNTER — Other Ambulatory Visit: Payer: Self-pay | Admitting: Family Medicine

## 2023-03-13 MED ORDER — SEMAGLUTIDE (1 MG/DOSE) 4 MG/3ML ~~LOC~~ SOPN: 1.0000 mg | PEN_INJECTOR | SUBCUTANEOUS | 0 refills | Status: DC

## 2023-04-06 ENCOUNTER — Other Ambulatory Visit: Payer: Self-pay | Admitting: Family Medicine

## 2023-04-08 ENCOUNTER — Other Ambulatory Visit: Payer: Self-pay | Admitting: Family Medicine

## 2023-04-17 ENCOUNTER — Ambulatory Visit (INDEPENDENT_AMBULATORY_CARE_PROVIDER_SITE_OTHER): Payer: 59

## 2023-04-17 DIAGNOSIS — E119 Type 2 diabetes mellitus without complications: Secondary | ICD-10-CM | POA: Diagnosis not present

## 2023-04-17 LAB — HM DIABETES EYE EXAM

## 2023-04-17 NOTE — Progress Notes (Signed)
Tony Terrell arrived 04/17/2023 and has given verbal consent to obtain images and complete their overdue diabetic retinal screening.  The images have been sent to an ophthalmologist or optometrist for review and interpretation.  Results will be sent back to [name], [place], FNP for review.  Patient has been informed they will be contacted when we receive the results via telephone or MyChart

## 2023-05-02 ENCOUNTER — Ambulatory Visit: Payer: 59 | Admitting: Family Medicine

## 2023-05-02 VITALS — BP 133/86 | HR 87 | Ht 72.0 in | Wt 310.0 lb

## 2023-05-02 DIAGNOSIS — E119 Type 2 diabetes mellitus without complications: Secondary | ICD-10-CM

## 2023-05-02 DIAGNOSIS — J4520 Mild intermittent asthma, uncomplicated: Secondary | ICD-10-CM

## 2023-05-02 DIAGNOSIS — Z7985 Long-term (current) use of injectable non-insulin antidiabetic drugs: Secondary | ICD-10-CM

## 2023-05-02 DIAGNOSIS — E785 Hyperlipidemia, unspecified: Secondary | ICD-10-CM

## 2023-05-02 DIAGNOSIS — E782 Mixed hyperlipidemia: Secondary | ICD-10-CM

## 2023-05-02 DIAGNOSIS — E1169 Type 2 diabetes mellitus with other specified complication: Secondary | ICD-10-CM

## 2023-05-02 MED ORDER — ALBUTEROL SULFATE HFA 108 (90 BASE) MCG/ACT IN AERS: 1.0000 | INHALATION_SPRAY | Freq: Four times a day (QID) | RESPIRATORY_TRACT | 4 refills | Status: DC | PRN

## 2023-05-02 MED ORDER — SEMAGLUTIDE (2 MG/DOSE) 8 MG/3ML ~~LOC~~ SOPN: 2.0000 mg | PEN_INJECTOR | SUBCUTANEOUS | 2 refills | Status: DC

## 2023-05-02 NOTE — Assessment & Plan Note (Signed)
Hemoglobin A1C 6.8, Labs ordered today awaiting results will follow up. Patient reports taking Ozempic injection once a week. Discussed medication desired effects, potential side effects, and how to administer the medication. Nonpharmacological interventions such as low carb diet,high in protein, vegetables and fruit discussed. Educated on importance of physical activity 150 minutes per week. Discussed signs and symptoms of hypoglycemia, & hyperglycemia and need to present to the ED if symptoms occurs.Follow up in 3 months or sooner if needed. Patient verbalizes understanding regarding plan of care and all questions answered. Ophthalmology exam up to date , Foot exam within desired limits

## 2023-05-02 NOTE — Patient Instructions (Signed)

## 2023-05-02 NOTE — Progress Notes (Signed)
Patient Office Visit   Subjective   Patient ID: Tony Terrell, male    DOB: 29-Jul-1981  Age: 42 y.o. MRN: [id number]  CC:  Chief Complaint  Patient presents with   Diabetes    HPI Tony Terrell 42 year old male, presents to the clinic for type 2 diabetes follow up. He  has a past medical history of Asthma, Hyperlipidemia, and Type 2 diabetes mellitus (HCC).  Diabetes He presents for his follow-up diabetic visit. He has type 2 diabetes mellitus. Pertinent negatives for hypoglycemia include no dizziness, headaches, speech difficulty or tremors. Pertinent negatives for diabetes include no blurred vision, no chest pain, no fatigue, no foot ulcerations, no polydipsia and no polyphagia. Pertinent negatives for hypoglycemia complications include no blackouts. Risk factors for coronary artery disease include dyslipidemia, diabetes mellitus, male sex and tobacco exposure. Current diabetic treatments: Ozempic injections. He is compliant with treatment all of the time. He is following a diabetic diet. Meal planning includes avoidance of concentrated sweets. He has not had a previous visit with a dietitian. He rarely participates in exercise. He does not see a podiatrist.Eye exam is current.      Outpatient Encounter Medications as of 05/02/2023  Medication Sig   albuterol (VENTOLIN HFA) 108 (90 Base) MCG/ACT inhaler Inhale 1 puff into the lungs every 6 (six) hours as needed for wheezing or shortness of breath.   Cholecalciferol (VITAMIN D3) 50 MCG (2000 UT) capsule Take 1 capsule (2,000 Units total) by mouth daily.   OZEMPIC, 1 MG/DOSE, 4 MG/3ML SOPN INJECT 1 MG AS DIRECTED ONCE A WEEK   rosuvastatin (CRESTOR) 40 MG tablet Take 1 tablet (40 mg total) by mouth daily.   cyclobenzaprine (FLEXERIL) 5 MG tablet Take 1 tablet (5 mg total) by mouth 3 (three) times daily as needed for muscle spasms. (Patient not taking: Reported on 05/02/2023)   [DISCONTINUED] metFORMIN (GLUCOPHAGE) 500 MG tablet Take 1  tablet (500 mg total) by mouth 2 (two) times daily with a meal.   [DISCONTINUED] Vitamin D, Ergocalciferol, (DRISDOL) 1.25 MG (50000 UNIT) CAPS capsule Take 1 capsule (50,000 Units total) by mouth every 7 (seven) days.   No facility-administered encounter medications on file as of 05/02/2023.    Past Surgical History:  Procedure Laterality Date   HAND SURGERY Right 1992    Review of Systems  Constitutional:  Negative for chills, fatigue and fever.  Eyes:  Negative for blurred vision.  Respiratory:  Negative for shortness of breath.   Cardiovascular:  Negative for chest pain.  Gastrointestinal:  Negative for abdominal pain.  Genitourinary:  Negative for dysuria.  Neurological:  Negative for dizziness, tremors, speech difficulty and headaches.  Endo/Heme/Allergies:  Negative for polydipsia and polyphagia.      Objective    BP 133/86   Pulse 87   Ht 6' (1.829 m)   Wt (!) 310 lb (140.6 kg)   SpO2 95%   BMI 42.04 kg/m   Physical Exam Vitals reviewed.  Constitutional:      General: He is not in acute distress.    Appearance: Normal appearance. He is not ill-appearing, toxic-appearing or diaphoretic.  HENT:     Head: Normocephalic.  Eyes:     General:        Right eye: No discharge.        Left eye: No discharge.     Conjunctiva/sclera: Conjunctivae normal.  Cardiovascular:     Rate and Rhythm: Normal rate.     Pulses: Normal pulses.  Heart sounds: Normal heart sounds.  Pulmonary:     Effort: Pulmonary effort is normal. No respiratory distress.     Breath sounds: Normal breath sounds.  Musculoskeletal:        General: Normal range of motion.     Cervical back: Normal range of motion.  Skin:    General: Skin is warm and dry.     Capillary Refill: Capillary refill takes less than 2 seconds.  Neurological:     Mental Status: He is alert.     Coordination: Coordination normal.     Gait: Gait normal.  Psychiatric:        Mood and Affect: Mood normal.        Assessment & Plan:  Type 2 diabetes mellitus without complication, without long-term current use of insulin (HCC)  Mild intermittent asthma without complication  Mixed hyperlipidemia  Hyperlipidemia associated with type 2 diabetes mellitus (HCC) Assessment & Plan: Hemoglobin A1C 6.8, Labs ordered today awaiting results will follow up. Patient reports taking Ozempic injection once a week. Discussed medication desired effects, potential side effects, and how to administer the medication. Nonpharmacological interventions such as low carb diet,high in protein, vegetables and fruit discussed. Educated on importance of physical activity 150 minutes per week. Discussed signs and symptoms of hypoglycemia, & hyperglycemia and need to present to the ED if symptoms occurs.Follow up in 3 months or sooner if needed. Patient verbalizes understanding regarding plan of care and all questions answered. Ophthalmology exam up to date , Foot exam within desired limits      Return in about 4 months (around 09/01/2023), or if symptoms worsen or fail to improve, for type 2 diabetes.   Cruzita Lederer Newman Nip, FNP

## 2023-05-03 LAB — LIPID PANEL
Chol/HDL Ratio: 6.8 {ratio} — ABNORMAL HIGH (ref 0.0–5.0)
Cholesterol, Total: 189 mg/dL (ref 100–199)
HDL: 28 mg/dL — ABNORMAL LOW (ref >39)
LDL Chol Calc (NIH): 142 mg/dL — ABNORMAL HIGH (ref 0–99)
Triglycerides: 101 mg/dL (ref 0–149)
VLDL Cholesterol Cal: 19 mg/dL (ref 5–40)

## 2023-05-03 LAB — HEMOGLOBIN A1C
Est. average glucose Bld gHb Est-mCnc: 126 mg/dL
Hgb A1c MFr Bld: 6 % — ABNORMAL HIGH (ref 4.8–5.6)

## 2023-07-16 ENCOUNTER — Other Ambulatory Visit: Payer: Self-pay | Admitting: Family Medicine

## 2023-08-12 ENCOUNTER — Telehealth: Payer: 59 | Admitting: Nurse Practitioner

## 2023-08-12 ENCOUNTER — Other Ambulatory Visit: Payer: Self-pay | Admitting: Family Medicine

## 2023-08-12 DIAGNOSIS — B9789 Other viral agents as the cause of diseases classified elsewhere: Secondary | ICD-10-CM | POA: Diagnosis not present

## 2023-08-12 DIAGNOSIS — J329 Chronic sinusitis, unspecified: Secondary | ICD-10-CM | POA: Diagnosis not present

## 2023-08-12 NOTE — Progress Notes (Signed)
 E-Visit for Sinus Problems  We are sorry that you are not feeling well.  Here is how we plan to help!  Based on what you have shared with me it looks like you have sinusitis.  Sinusitis is inflammation and infection in the sinus cavities of the head.  Based on your presentation I believe you most likely have Acute Viral Sinusitis.This is an infection most likely caused by a virus. There is not specific treatment for viral sinusitis other than to help you with the symptoms until the infection runs its course.  You may use an oral decongestant such as Mucinex D or if you have glaucoma or high blood pressure use plain Mucinex. Saline nasal spray help and can safely be used as often as needed for congestion, I have prescribed: Ipratropium Bromide nasal spray 0.03% 2 sprays in eah nostril 2-3 times a day  We do not typically recommend antibiotics prior to 7-10 days of symptoms, Providers prescribe antibiotics to treat infections caused by bacteria. Antibiotics are very powerful in treating bacterial infections when they are used properly. To maintain their effectiveness, they should be used only when necessary. Overuse of antibiotics has resulted in the development of superbugs that are resistant to treatment!    After careful review of your answers, I would not recommend an antibiotic for your condition.  Antibiotics are not effective against viruses and therefore should not be used to treat them. Common examples of infections caused by viruses include colds and flu   Some authorities believe that zinc sprays or the use of Echinacea may shorten the course of your symptoms.  Sinus infections are not as easily transmitted as other respiratory infection, however we still recommend that you avoid close contact with loved ones, especially the very young and elderly.  Remember to wash your hands thoroughly throughout the day as this is the number one way to prevent the spread of infection!  Home Care: Only take  medications as instructed by your medical team. Do not take these medications with alcohol. A steam or ultrasonic humidifier can help congestion.  You can place a towel over your head and breathe in the steam from hot water coming from a faucet. Avoid close contacts especially the very young and the elderly. Cover your mouth when you cough or sneeze. Always remember to wash your hands.  Get Help Right Away If: You develop worsening fever or sinus pain. You develop a severe head ache or visual changes. Your symptoms persist after you have completed your treatment plan.  Make sure you Understand these instructions. Will watch your condition. Will get help right away if you are not doing well or get worse.   Thank you for choosing an e-visit.  Your e-visit answers were reviewed by a board certified advanced clinical practitioner to complete your personal care plan. Depending upon the condition, your plan could have included both over the counter or prescription medications.  Please review your pharmacy choice. Make sure the pharmacy is open so you can pick up prescription now. If there is a problem, you may contact your provider through [employer] and have the prescription routed to another pharmacy.  Your safety is important to us . If you have drug allergies check your prescription carefully.   For the next 24 hours you can use MyChart to ask questions about today's visit, request a non-urgent call back, or ask for a work or school excuse. You will get an email in the next two days asking about  your experience. I hope that your e-visit has been valuable and will speed your recovery.  I spent approximately 5 minutes reviewing the patient's history, current symptoms and coordinating their care today.

## 2023-09-03 NOTE — Progress Notes (Unsigned)
   Established Patient Office Visit   Subjective  Patient ID: Tony Terrell, male    DOB: 12-25-1980  Age: 43 y.o. MRN: [id number]  No chief complaint on file.   He  has a past medical history of Asthma, Hyperlipidemia, and Type 2 diabetes mellitus (HCC).  HPI  ROS    Objective:     There were no vitals taken for this visit. {Vitals History (Optional):23777}  Physical Exam   No results found for any visits on 09/04/23.  The 10-year ASCVD risk score (Arnett DK, et al., 2019) is: 18.6%    Assessment & Plan:  There are no diagnoses linked to this encounter.  No follow-ups on file.   Cruzita Lederer Newman Nip, FNP

## 2023-09-03 NOTE — Patient Instructions (Signed)

## 2023-09-04 ENCOUNTER — Ambulatory Visit (INDEPENDENT_AMBULATORY_CARE_PROVIDER_SITE_OTHER): Payer: 59 | Admitting: Family Medicine

## 2023-09-04 VITALS — BP 119/85 | HR 79 | Ht 72.0 in | Wt 304.0 lb

## 2023-09-04 DIAGNOSIS — E1169 Type 2 diabetes mellitus with other specified complication: Secondary | ICD-10-CM

## 2023-09-04 DIAGNOSIS — E782 Mixed hyperlipidemia: Secondary | ICD-10-CM | POA: Diagnosis not present

## 2023-09-04 DIAGNOSIS — Z7985 Long-term (current) use of injectable non-insulin antidiabetic drugs: Secondary | ICD-10-CM

## 2023-09-04 DIAGNOSIS — N529 Male erectile dysfunction, unspecified: Secondary | ICD-10-CM | POA: Diagnosis not present

## 2023-09-04 DIAGNOSIS — E119 Type 2 diabetes mellitus without complications: Secondary | ICD-10-CM

## 2023-09-04 NOTE — Assessment & Plan Note (Addendum)
Last Hemoglobin A1c: 6.0 , Previous LDL 142, currently on Crestor 40 mg once daily Labs: Ordered today, results pending; will follow up accordingly. The patient reports adhering to prescribed medications: Ozempic weekly injections  Reviewed non-pharmacological interventions, including a balanced diet rich in lean proteins, healthy fats, whole grains, and high-fiber vegetables. Emphasized reducing refined sugars and processed carbohydrates, and incorporating more fruits, leafy greens, and legumes. Education: Patient was educated on recognizing signs and symptoms of both hypoglycemia and hyperglycemia, and advised to seek emergency care if these symptoms occur. Follow-Up: Scheduled for follow-up in 3-6 months, or sooner if needed. Patient Understanding: The patient verbalized understanding of the care plan, and all questions were answered. Additional Care: Ophthalmology current. Foot exam results were within normal limits.

## 2023-09-05 ENCOUNTER — Other Ambulatory Visit: Payer: Self-pay | Admitting: Family Medicine

## 2023-09-05 ENCOUNTER — Encounter: Payer: Self-pay | Admitting: Family Medicine

## 2023-09-05 LAB — CBC WITH DIFFERENTIAL/PLATELET
Basophils Absolute: 0.1 10*3/uL (ref 0.0–0.2)
Basos: 1 %
EOS (ABSOLUTE): 0.5 10*3/uL — ABNORMAL HIGH (ref 0.0–0.4)
Eos: 4 %
Hematocrit: 42.5 % (ref 37.5–51.0)
Hemoglobin: 14.5 g/dL (ref 13.0–17.7)
Immature Grans (Abs): 0 10*3/uL (ref 0.0–0.1)
Immature Granulocytes: 0 %
Lymphocytes Absolute: 3.6 10*3/uL — ABNORMAL HIGH (ref 0.7–3.1)
Lymphs: 29 %
MCH: 31.9 pg (ref 26.6–33.0)
MCHC: 34.1 g/dL (ref 31.5–35.7)
MCV: 93 fL (ref 79–97)
Monocytes Absolute: 0.9 10*3/uL (ref 0.1–0.9)
Monocytes: 7 %
Neutrophils Absolute: 7.6 10*3/uL — ABNORMAL HIGH (ref 1.4–7.0)
Neutrophils: 59 %
Platelets: 365 10*3/uL (ref 150–450)
RBC: 4.55 x10E6/uL (ref 4.14–5.80)
RDW: 12.6 % (ref 11.6–15.4)
WBC: 12.6 10*3/uL — ABNORMAL HIGH (ref 3.4–10.8)

## 2023-09-05 LAB — BMP8+EGFR
BUN/Creatinine Ratio: 10 (ref 9–20)
BUN: 9 mg/dL (ref 6–24)
CO2: 24 mmol/L (ref 20–29)
Calcium: 9.5 mg/dL (ref 8.7–10.2)
Chloride: 103 mmol/L (ref 96–106)
Creatinine, Ser: 0.92 mg/dL (ref 0.76–1.27)
Glucose: 88 mg/dL (ref 70–99)
Potassium: 5.2 mmol/L (ref 3.5–5.2)
Sodium: 143 mmol/L (ref 134–144)
eGFR: 105 mL/min/{1.73_m2} (ref >59)

## 2023-09-05 LAB — LIPID PANEL
Chol/HDL Ratio: 5.9 {ratio} — ABNORMAL HIGH (ref 0.0–5.0)
Cholesterol, Total: 178 mg/dL (ref 100–199)
HDL: 30 mg/dL — ABNORMAL LOW (ref >39)
LDL Chol Calc (NIH): 132 mg/dL — ABNORMAL HIGH (ref 0–99)
Triglycerides: 83 mg/dL (ref 0–149)
VLDL Cholesterol Cal: 16 mg/dL (ref 5–40)

## 2023-09-05 LAB — HEMOGLOBIN A1C
Est. average glucose Bld gHb Est-mCnc: 126 mg/dL
Hgb A1c MFr Bld: 6 % — ABNORMAL HIGH (ref 4.8–5.6)

## 2023-10-09 ENCOUNTER — Ambulatory Visit: Payer: 59 | Admitting: Urology

## 2023-10-09 VITALS — BP 122/77 | HR 91

## 2023-10-09 DIAGNOSIS — N5201 Erectile dysfunction due to arterial insufficiency: Secondary | ICD-10-CM

## 2023-10-09 NOTE — Progress Notes (Signed)
 Subjective: 1. Erectile dysfunction due to arterial insufficiency      Consult requested by Tony Terrell is a 43 yo male who presents for erectile dysfunction.  He has life long issues with ED and can get a partial erection that he can't maintain. He can have a climax with the partial erection.   He has tried both sildenafil and tadalafil alone and in combination without response but with a headache.    He has an adequate libido and he reports a normal testosterone.  He has had no pain or curvature.  He was diagnosed with DM in 2023 and is on Ozempic.  He has lost about 80lb with the med.   He is a smoker with an 11pk year history.   ROS:  Review of Systems  Respiratory:  Positive for shortness of breath.   Musculoskeletal:  Positive for back pain and joint pain.  All other systems reviewed and are negative.   No Known Allergies  Past Medical History:  Diagnosis Date   Asthma    Hyperlipidemia    Type 2 diabetes mellitus (HCC)     Past Surgical History:  Procedure Laterality Date   HAND SURGERY Right 1992    Social History   Socioeconomic History   Marital status: Married    Spouse name: Zakaria Sedor   Number of children: 0   Years of education: Not on file   Highest education level: Associate degree: occupational, Scientist, product/process development, or vocational program  Occupational History   Not on file  Tobacco Use   Smoking status: Some Days    Current packs/day: 0.50    Average packs/day: 0.5 packs/day for 22.0 years (11.0 ttl pk-yrs)    Types: Cigarettes   Smokeless tobacco: Not on file   Tobacco comments:    Started smoking in 2001, smokes 10 cigarettes a day.   Vaping Use   Vaping status: Never Used  Substance and Sexual Activity   Alcohol use: Yes    Comment: rare   Drug use: No   Sexual activity: Yes    Comment: married  Other Topics Concern   Not on file  Social History Narrative   Married,Works at [employer] brand.   Social Drivers of Manufacturing engineer Strain: Low Risk  (09/04/2023)   Overall Financial Resource Strain (CARDIA)    Difficulty of Paying Living Expenses: Not hard at all  Food Insecurity: No Food Insecurity (09/04/2023)   Hunger Vital Sign    Worried About Running Out of Food in the Last Year: Never true    Ran Out of Food in the Last Year: Never true  Transportation Needs: No Transportation Needs (09/04/2023)   PRAPARE - Administrator, Civil Service (Medical): No    Lack of Transportation (Non-Medical): No  Physical Activity: Insufficiently Active (09/04/2023)   Exercise Vital Sign    Days of Exercise per Week: 5 days    Minutes of Exercise per Session: 20 min  Stress: No Stress Concern Present (09/04/2023)   [employer] of Occupational Health - Occupational Stress Questionnaire    Feeling of Stress : Not at all  Social Connections: Moderately Isolated (09/04/2023)   Social Connection and Isolation Panel [NHANES]    Frequency of Communication with Friends and Family: Never    Frequency of Social Gatherings with Friends and Family: Never    Attends Religious Services: Never    Database administrator or Organizations: Yes    Attends  Club or Organization Meetings: Never    Marital Status: Married  Catering manager Violence: Not on file    Family History  Problem Relation Age of Onset   Heart disease Father    Heart disease Paternal Aunt    Heart disease Paternal Uncle    Colon cancer Neg Hx    Prostate cancer Neg Hx    Lung cancer Neg Hx     Anti-infectives: Anti-infectives (From admission, onward)    None       Current Outpatient Medications  Medication Sig Dispense Refill   albuterol (VENTOLIN HFA) 108 (90 Base) MCG/ACT inhaler Inhale 1 puff into the lungs every 6 (six) hours as needed for wheezing or shortness of breath. 1 each 4   Cholecalciferol (VITAMIN D3) 50 MCG (2000 UT) capsule Take 1 capsule (2,000 Units total) by mouth daily. 90 capsule 1   rosuvastatin  (CRESTOR) 40 MG tablet Take 1 tablet (40 mg total) by mouth daily. 90 tablet 1   Semaglutide, 2 MG/DOSE, (OZEMPIC, 2 MG/DOSE,) 8 MG/3ML SOPN INJECT 2 MG  INTO THE SKIN ONCE A WEEK 3 mL 2   cyclobenzaprine (FLEXERIL) 5 MG tablet Take 1 tablet (5 mg total) by mouth 3 (three) times daily as needed for muscle spasms. (Patient not taking: Reported on 05/02/2023) 30 tablet 1   No current facility-administered medications for this visit.     Objective: Vital signs in last 24 hours: BP 122/77   Pulse 91   Intake/Output from previous day: No intake/output data recorded. Intake/Output this shift: @IOTHISSHIFT @   Physical Exam Vitals reviewed.  Constitutional:      Appearance: Normal appearance. He is obese.  Cardiovascular:     Rate and Rhythm: Normal rate and regular rhythm.     Heart sounds: Normal heart sounds.  Pulmonary:     Effort: Pulmonary effort is normal. No respiratory distress.     Breath sounds: Normal breath sounds.  Abdominal:     Palpations: Abdomen is soft.     Hernia: No hernia is present.     Comments: obese  Genitourinary:    Comments: Nl uncircumcised phallus with adequate meatus. Scrotum, testes and epididymis normal.  Musculoskeletal:        General: No swelling or tenderness. Normal range of motion.  Skin:    General: Skin is warm and dry.  Neurological:     General: No focal deficit present.     Mental Status: He is alert and oriented to person, place, and time.  Psychiatric:        Mood and Affect: Mood normal.        Behavior: Behavior normal.     Lab Results:  Results for orders placed or performed in visit on 10/09/23 (from the past 24 hours)  Urinalysis, Routine w reflex microscopic     Status: Abnormal   Collection Time: 10/09/23  2:27 PM  Result Value Ref Range   Specific Gravity, UA >1.030 (H) 1.005 - 1.030   pH, UA 6.0 5.0 - 7.5   Color, UA Orange Yellow   Appearance Ur Clear Clear   Leukocytes,UA Trace (A) Negative   Protein,UA  Negative Negative/Trace   Glucose, UA Negative Negative   Ketones, UA Trace (A) Negative   RBC, UA Negative Negative   Bilirubin, UA Negative Negative   Urobilinogen, Ur 4.0 (H) 0.2 - 1.0 mg/dL   Nitrite, UA Negative Negative   Microscopic Examination See below:    Narrative   Performed at:  01 -  [facility] [address] [place], [place]  [id number] Lab Director: Chinita Pester MT, Phone:  [phone]  Microscopic Examination     Status: Abnormal   Collection Time: 10/09/23  2:27 PM   Urine  Result Value Ref Range   WBC, UA 0-5 0 - 5 /hpf   RBC, Urine 0-2 0 - 2 /hpf   Epithelial Cells (non renal) 0-10 0 - 10 /hpf   Mucus, UA Present (A) Not Estab.   Bacteria, UA Few None seen/Few   Narrative   Performed at:  [address] - [facility] [address] [place], [place]  [id number] Lab Director: Chinita Pester MT, Phone:  [phone]    BMET No results for input(s): "NA", "K", "CL", "CO2", "GLUCOSE", "BUN", "CREATININE", "CALCIUM" in the last 72 hours. PT/INR No results for input(s): "LABPROT", "INR" in the last 72 hours. ABG No results for input(s): "PHART", "HCO3" in the last 72 hours.  Invalid input(s): "PCO2", "PO2"  Studies/Results: No results found.   Assessment/Plan: Erectile dysfunction.   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   He is not interested in injection therapy and has failed a VED.   He is most interested in a penile implant.   I discussed the procedure with him and reviewed the risks in detail and outlined in the patient information he was given.   I will get him set up for that procedure with Dr. Ronne Binning and will have him return for a preop visit.     No orders of the defined types were placed in this encounter.    Orders Placed This Encounter  Procedures   Microscopic Examination   Urinalysis, Routine w reflex microscopic     Return for I  will get him posted for a penile implant with Dr. Ronne Binning but he will need f/u with him preop. ,.    CC: Yvonna Alanis 10/10/2023

## 2023-10-10 ENCOUNTER — Encounter: Payer: Self-pay | Admitting: Urology

## 2023-10-10 LAB — URINALYSIS, ROUTINE W REFLEX MICROSCOPIC
Bilirubin, UA: NEGATIVE
Glucose, UA: NEGATIVE
Nitrite, UA: NEGATIVE
Protein,UA: NEGATIVE
RBC, UA: NEGATIVE
Specific Gravity, UA: >1.03 — ABNORMAL HIGH (ref 1.005–1.030)
Urobilinogen, Ur: 4 mg/dL — ABNORMAL HIGH (ref 0.2–1.0)
pH, UA: 6 (ref 5.0–7.5)

## 2023-10-10 LAB — MICROSCOPIC EXAMINATION

## 2023-10-14 ENCOUNTER — Other Ambulatory Visit: Payer: Self-pay

## 2023-10-14 DIAGNOSIS — N5201 Erectile dysfunction due to arterial insufficiency: Secondary | ICD-10-CM

## 2023-10-23 ENCOUNTER — Other Ambulatory Visit: Payer: Self-pay

## 2023-10-23 NOTE — Progress Notes (Signed)
 Opened in error

## 2023-11-06 ENCOUNTER — Other Ambulatory Visit: Payer: Self-pay

## 2023-11-06 DIAGNOSIS — N5201 Erectile dysfunction due to arterial insufficiency: Secondary | ICD-10-CM

## 2023-11-12 ENCOUNTER — Ambulatory Visit: Admitting: Urology

## 2023-11-20 ENCOUNTER — Other Ambulatory Visit: Payer: Self-pay | Admitting: Family Medicine

## 2023-12-11 ENCOUNTER — Ambulatory Visit (HOSPITAL_COMMUNITY): Admit: 2023-12-11 | Admitting: Urology

## 2023-12-11 SURGERY — INSERTION, PENILE PROSTHESIS
Anesthesia: General

## 2023-12-20 ENCOUNTER — Other Ambulatory Visit: Payer: Self-pay | Admitting: Family Medicine

## 2023-12-24 ENCOUNTER — Ambulatory Visit: Admitting: Urology

## 2024-01-12 ENCOUNTER — Ambulatory Visit: Admitting: Urology

## 2024-03-05 ENCOUNTER — Ambulatory Visit: Payer: 59 | Admitting: Family Medicine

## 2024-03-05 VITALS — BP 132/86 | HR 86 | Ht 72.0 in | Wt 302.0 lb

## 2024-03-05 DIAGNOSIS — E1169 Type 2 diabetes mellitus with other specified complication: Secondary | ICD-10-CM

## 2024-03-05 DIAGNOSIS — E782 Mixed hyperlipidemia: Secondary | ICD-10-CM | POA: Diagnosis not present

## 2024-03-05 DIAGNOSIS — E559 Vitamin D deficiency, unspecified: Secondary | ICD-10-CM

## 2024-03-05 DIAGNOSIS — E119 Type 2 diabetes mellitus without complications: Secondary | ICD-10-CM

## 2024-03-05 DIAGNOSIS — E038 Other specified hypothyroidism: Secondary | ICD-10-CM | POA: Diagnosis not present

## 2024-03-05 DIAGNOSIS — Z7985 Long-term (current) use of injectable non-insulin antidiabetic drugs: Secondary | ICD-10-CM

## 2024-03-05 DIAGNOSIS — J4520 Mild intermittent asthma, uncomplicated: Secondary | ICD-10-CM

## 2024-03-05 DIAGNOSIS — R5383 Other fatigue: Secondary | ICD-10-CM

## 2024-03-05 MED ORDER — ALBUTEROL SULFATE HFA 108 (90 BASE) MCG/ACT IN AERS
1.0000 | INHALATION_SPRAY | Freq: Four times a day (QID) | RESPIRATORY_TRACT | 6 refills | Status: AC | PRN
Start: 2024-03-05 — End: ?

## 2024-03-05 NOTE — Assessment & Plan Note (Signed)
 Last Hemoglobin A1c:  6.0 Labs: Ordered today, results pending; will follow up accordingly. The patient reports adhering to prescribed medications: Ozempic  weekly injections   Reviewed non-pharmacological interventions, including a balanced diet rich in lean proteins, healthy fats, whole grains, and high-fiber vegetables. Emphasized reducing refined sugars and processed carbohydrates, and incorporating more fruits, leafy greens, and legumes. Education: Patient was educated on recognizing signs and symptoms of both hypoglycemia and hyperglycemia, and advised to seek emergency care if these symptoms occur. Follow-Up: Scheduled for follow-up in 3-4 months, or sooner if needed. Patient Understanding: The patient verbalized understanding of the care plan, and all questions were answered. Additional Care: Ophthalmology exam current. Foot exam results were within normal limits.

## 2024-03-05 NOTE — Patient Instructions (Signed)

## 2024-03-05 NOTE — Progress Notes (Signed)
 Established Patient Office Visit   Subjective  Patient ID: Tony Terrell, male    DOB: [birth year]  Age: 43 y.o. MRN: [id number]  Chief Complaint  Patient presents with   Diabetes    Six month follow up     He  has a past medical history of Asthma, Hyperlipidemia, and Type 2 diabetes mellitus (HCC).  HPI Patient presents to the clinic for chronic follow up. For the details of today's visit, please refer to assessment and plan.   Review of Systems  Constitutional:  Negative for chills and fever.  Eyes:  Negative for blurred vision.  Respiratory:  Positive for shortness of breath.   Cardiovascular:  Negative for chest pain.  Gastrointestinal:  Negative for abdominal pain.  Genitourinary:  Negative for dysuria.  Neurological:  Negative for dizziness and headaches.      Objective:     BP 132/86   Pulse 86   Ht 6' (1.829 m)   Wt (!) 302 lb (137 kg)   SpO2 95%   BMI 40.96 kg/m  BP Readings from Last 3 Encounters:  03/05/24 132/86  10/09/23 122/77  09/04/23 119/85      Physical Exam Vitals reviewed.  Constitutional:      General: He is not in acute distress.    Appearance: Normal appearance. He is not ill-appearing, toxic-appearing or diaphoretic.  HENT:     Head: Normocephalic.  Eyes:     General:        Right eye: No discharge.        Left eye: No discharge.     Conjunctiva/sclera: Conjunctivae normal.  Cardiovascular:     Rate and Rhythm: Normal rate.     Pulses: Normal pulses.     Heart sounds: Normal heart sounds.  Pulmonary:     Effort: Pulmonary effort is normal. No respiratory distress.     Breath sounds: Normal breath sounds.  Skin:    General: Skin is warm and dry.     Capillary Refill: Capillary refill takes less than 2 seconds.  Neurological:     Mental Status: He is alert.     Coordination: Coordination normal.     Gait: Gait normal.  Psychiatric:        Mood and Affect: Mood normal.        Behavior: Behavior normal.      No results  found for any visits on 03/05/24.  The 10-year ASCVD risk score (Arnett DK, et al., 2019) is: 16.3%    Assessment & Plan:  Type 2 diabetes mellitus with other specified complication, without long-term current use of insulin (HCC) -     Hemoglobin A1c -     Microalbumin / creatinine urine ratio  TSH (thyroid-stimulating hormone deficiency) -     TSH + free T4  Vitamin D  deficiency -     VITAMIN D  25 Hydroxy (Vit-D Deficiency, Fractures)  Mixed hyperlipidemia -     BMP8+eGFR -     Lipid panel -     CBC with Differential/Platelet  Other fatigue -     Testosterone,Free and Total -     ACTH -     Cortisol-am, blood  Mild intermittent asthma without complication -     Albuterol  Sulfate HFA; Inhale 1 puff into the lungs every 6 (six) hours as needed for wheezing or shortness of breath.  Dispense: 1 each; Refill: 6  Controlled type 2 diabetes mellitus without complication, without long-term current use of insulin (HCC) Assessment &  Plan: Last Hemoglobin A1c:  6.0 Labs: Ordered today, results pending; will follow up accordingly. The patient reports adhering to prescribed medications: Ozempic  weekly injections   Reviewed non-pharmacological interventions, including a balanced diet rich in lean proteins, healthy fats, whole grains, and high-fiber vegetables. Emphasized reducing refined sugars and processed carbohydrates, and incorporating more fruits, leafy greens, and legumes. Education: Patient was educated on recognizing signs and symptoms of both hypoglycemia and hyperglycemia, and advised to seek emergency care if these symptoms occur. Follow-Up: Scheduled for follow-up in 3-4 months, or sooner if needed. Patient Understanding: The patient verbalized understanding of the care plan, and all questions were answered. Additional Care: Ophthalmology exam current. Foot exam results were within normal limits.      Return in about 4 months (around 07/05/2024), or if symptoms worsen or  fail to improve, for chronic follow-up.   Hilario Kidd Wilhelmena Falter, FNP

## 2024-03-07 ENCOUNTER — Other Ambulatory Visit: Payer: Self-pay | Admitting: Family Medicine

## 2024-03-08 LAB — LIPID PANEL
Chol/HDL Ratio: 6.2 ratio — ABNORMAL HIGH (ref 0.0–5.0)
Cholesterol, Total: 211 mg/dL — ABNORMAL HIGH (ref 100–199)
HDL: 34 mg/dL — ABNORMAL LOW (ref >39)
LDL Chol Calc (NIH): 154 mg/dL — ABNORMAL HIGH (ref 0–99)
Triglycerides: 124 mg/dL (ref 0–149)
VLDL Cholesterol Cal: 23 mg/dL (ref 5–40)

## 2024-03-08 LAB — CBC WITH DIFFERENTIAL/PLATELET
Basophils Absolute: 0.1 x10E3/uL (ref 0.0–0.2)
Basos: 1 %
EOS (ABSOLUTE): 0.5 x10E3/uL — ABNORMAL HIGH (ref 0.0–0.4)
Eos: 4 %
Hematocrit: 46.2 % (ref 37.5–51.0)
Hemoglobin: 14.7 g/dL (ref 13.0–17.7)
Immature Grans (Abs): 0 x10E3/uL (ref 0.0–0.1)
Immature Granulocytes: 0 %
Lymphocytes Absolute: 3.5 x10E3/uL — ABNORMAL HIGH (ref 0.7–3.1)
Lymphs: 31 %
MCH: 30.6 pg (ref 26.6–33.0)
MCHC: 31.8 g/dL (ref 31.5–35.7)
MCV: 96 fL (ref 79–97)
Monocytes Absolute: 0.7 x10E3/uL (ref 0.1–0.9)
Monocytes: 6 %
Neutrophils Absolute: 6.6 x10E3/uL (ref 1.4–7.0)
Neutrophils: 58 %
Platelets: 378 x10E3/uL (ref 150–450)
RBC: 4.8 x10E6/uL (ref 4.14–5.80)
RDW: 12.6 % (ref 11.6–15.4)
WBC: 11.5 x10E3/uL — ABNORMAL HIGH (ref 3.4–10.8)

## 2024-03-08 LAB — TESTOSTERONE,FREE AND TOTAL
Testosterone, Free: 6.6 pg/mL — AB (ref 6.8–21.5)
Testosterone: 589 ng/dL (ref 264–916)

## 2024-03-08 LAB — BMP8+EGFR
BUN/Creatinine Ratio: 12 (ref 9–20)
BUN: 10 mg/dL (ref 6–24)
CO2: 22 mmol/L (ref 20–29)
Calcium: 9.5 mg/dL (ref 8.7–10.2)
Chloride: 101 mmol/L (ref 96–106)
Creatinine, Ser: 0.86 mg/dL (ref 0.76–1.27)
Glucose: 84 mg/dL (ref 70–99)
Potassium: 5 mmol/L (ref 3.5–5.2)
Sodium: 139 mmol/L (ref 134–144)
eGFR: 109 mL/min/1.73 (ref >59)

## 2024-03-08 LAB — MICROALBUMIN / CREATININE URINE RATIO
Creatinine, Urine: 122.3 mg/dL
Microalb/Creat Ratio: <2 mg/g{creat} (ref 0–29)
Microalbumin, Urine: <3 ug/mL

## 2024-03-08 LAB — HEMOGLOBIN A1C
Est. average glucose Bld gHb Est-mCnc: 126 mg/dL
Hgb A1c MFr Bld: 6 % — ABNORMAL HIGH (ref 4.8–5.6)

## 2024-03-08 LAB — TSH+FREE T4
Free T4: 1.11 ng/dL (ref 0.82–1.77)
TSH: 3.26 u[IU]/mL (ref 0.450–4.500)

## 2024-03-08 LAB — VITAMIN D 25 HYDROXY (VIT D DEFICIENCY, FRACTURES): Vit D, 25-Hydroxy: 8.6 ng/mL — ABNORMAL LOW (ref 30.0–100.0)

## 2024-03-10 ENCOUNTER — Ambulatory Visit: Payer: Self-pay | Admitting: Family Medicine

## 2024-03-31 LAB — ACTH: ACTH: 11 pg/mL (ref 7.2–63.3)

## 2024-03-31 LAB — CORTISOL-AM, BLOOD: Cortisol - AM: 9.6 ug/dL (ref 6.2–19.4)

## 2024-05-13 ENCOUNTER — Other Ambulatory Visit: Payer: Self-pay | Admitting: Medical Genetics

## 2024-05-21 ENCOUNTER — Other Ambulatory Visit (HOSPITAL_COMMUNITY)

## 2024-05-21 ENCOUNTER — Other Ambulatory Visit (HOSPITAL_COMMUNITY)
Admission: RE | Admit: 2024-05-21 | Discharge: 2024-05-21 | Disposition: A | Discharge status: Home / Self Care | Payer: Self-pay | Source: Ambulatory Visit | Attending: Oncology | Admitting: Oncology

## 2024-05-22 ENCOUNTER — Other Ambulatory Visit: Payer: Self-pay | Admitting: Family Medicine

## 2024-05-31 LAB — GENECONNECT MOLECULAR SCREEN: Genetic Analysis Overall Interpretation: NEGATIVE

## 2024-07-09 ENCOUNTER — Ambulatory Visit

## 2024-07-10 ENCOUNTER — Other Ambulatory Visit: Payer: Self-pay | Admitting: Family Medicine

## 2024-08-06 ENCOUNTER — Other Ambulatory Visit: Payer: Self-pay

## 2024-08-12 ENCOUNTER — Emergency Department (HOSPITAL_COMMUNITY): Admission: EM | Admit: 2024-08-12 | Discharge: 2024-08-12 | Disposition: A | Discharge status: Home / Self Care

## 2024-08-12 ENCOUNTER — Other Ambulatory Visit: Payer: Self-pay

## 2024-08-12 ENCOUNTER — Encounter (HOSPITAL_COMMUNITY): Payer: Self-pay

## 2024-08-12 ENCOUNTER — Emergency Department (HOSPITAL_COMMUNITY)

## 2024-08-12 DIAGNOSIS — R110 Nausea: Secondary | ICD-10-CM | POA: Insufficient documentation

## 2024-08-12 DIAGNOSIS — R0789 Other chest pain: Secondary | ICD-10-CM | POA: Diagnosis present

## 2024-08-12 DIAGNOSIS — E119 Type 2 diabetes mellitus without complications: Secondary | ICD-10-CM | POA: Insufficient documentation

## 2024-08-12 DIAGNOSIS — R079 Chest pain, unspecified: Secondary | ICD-10-CM

## 2024-08-12 DIAGNOSIS — Z794 Long term (current) use of insulin: Secondary | ICD-10-CM | POA: Diagnosis not present

## 2024-08-12 DIAGNOSIS — D72829 Elevated white blood cell count, unspecified: Secondary | ICD-10-CM | POA: Diagnosis not present

## 2024-08-12 LAB — CBC WITH DIFFERENTIAL/PLATELET
Abs Immature Granulocytes: 0.08 K/uL — ABNORMAL HIGH (ref 0.00–0.07)
Basophils Absolute: 0.1 K/uL (ref 0.0–0.1)
Basophils Relative: 1 %
Eosinophils Absolute: 0.4 K/uL (ref 0.0–0.5)
Eosinophils Relative: 3 %
HCT: 45.2 % (ref 39.0–52.0)
Hemoglobin: 15.1 g/dL (ref 13.0–17.0)
Immature Granulocytes: 1 %
Lymphocytes Relative: 16 %
Lymphs Abs: 1.8 K/uL (ref 0.7–4.0)
MCH: 32.1 pg (ref 26.0–34.0)
MCHC: 33.4 g/dL (ref 30.0–36.0)
MCV: 96.2 fL (ref 80.0–100.0)
Monocytes Absolute: 0.7 K/uL (ref 0.1–1.0)
Monocytes Relative: 6 %
Neutro Abs: 8.3 K/uL — ABNORMAL HIGH (ref 1.7–7.7)
Neutrophils Relative %: 73 %
Platelets: 342 K/uL (ref 150–400)
RBC: 4.7 MIL/uL (ref 4.22–5.81)
RDW: 13.1 % (ref 11.5–15.5)
WBC: 11.2 K/uL — ABNORMAL HIGH (ref 4.0–10.5)
nRBC: 0 % (ref 0.0–0.2)

## 2024-08-12 LAB — BASIC METABOLIC PANEL WITH GFR
Anion gap: 8 (ref 5–15)
BUN: 12 mg/dL (ref 6–20)
CO2: 27 mmol/L (ref 22–32)
Calcium: 9.1 mg/dL (ref 8.9–10.3)
Chloride: 103 mmol/L (ref 98–111)
Creatinine, Ser: 0.83 mg/dL (ref 0.61–1.24)
GFR, Estimated: >60 mL/min
Glucose, Bld: 140 mg/dL — ABNORMAL HIGH (ref 70–99)
Potassium: 4.5 mmol/L (ref 3.5–5.1)
Sodium: 137 mmol/L (ref 135–145)

## 2024-08-12 LAB — TROPONIN T, HIGH SENSITIVITY
Troponin T High Sensitivity: <15 ng/L (ref 0–19)
Troponin T High Sensitivity: <15 ng/L (ref 0–19)

## 2024-08-12 NOTE — ED Provider Notes (Signed)
 " [place] [facility] Provider Note   CSN: [id number] Arrival date & time: 08/12/24  9155     Patient presents with: Chest Pain   Tony Terrell is a 44 y.o. male patient with past medical history of DM2, HLD, obesity presenting to ER with complaint of CP patient reports that he was sitting at his desk earlier today when he started to experience central chest pain.  He reports that this lasted for about 2 hours and did improve after taking aspirin. He had associated nausea and sweating. No vomiting or diarrhea. He denies any shortness of breath cough or fever.  No swelling in his lower extremities.  He denies any recent travel or recent surgery. No prior episodes, no hx of CAD, no exertional CP or SOB.    Chest Pain      Prior to Admission medications  Medication Sig Start Date End Date Taking? Authorizing Provider  albuterol  (VENTOLIN  HFA) 108 (90 Base) MCG/ACT inhaler Inhale 1 puff into the lungs every 6 (six) hours as needed for wheezing or shortness of breath. 03/05/24   Del Orbe Polanco, Iliana, FNP  Cholecalciferol (VITAMIN D3) 50 MCG (2000 UT) capsule Take 1 capsule (2,000 Units total) by mouth daily. 02/03/23   Del Orbe Polanco, Iliana, FNP  cyclobenzaprine  (FLEXERIL ) 5 MG tablet Take 1 tablet (5 mg total) by mouth 3 (three) times daily as needed for muscle spasms. Patient not taking: Reported on 05/02/2023 01/31/23   Del Orbe Polanco, Iliana, FNP  rosuvastatin  (CRESTOR ) 40 MG tablet Take 1 tablet (40 mg total) by mouth daily. 02/03/23   Del Wilhelmena Lloyd Sola, FNP  Semaglutide , 2 MG/DOSE, (OZEMPIC , 2 MG/DOSE,) 8 MG/3ML SOPN INJECT 2MG   SUBCUTANEOUSLY ONCE A WEEK 08/06/24   Bevely Doffing, FNP    Allergies: Patient has no known allergies.    Review of Systems  Cardiovascular:  Positive for chest pain.    Updated Vital Signs BP 126/87   Pulse 84   Temp 97.7 F (36.5 C) (Oral)   Resp 18   Ht 6' (1.829 m)   Wt (!) 145.2 kg   SpO2 96%   BMI  43.40 kg/m   Physical Exam Vitals and nursing note reviewed.  Constitutional:      General: He is not in acute distress.    Appearance: He is not toxic-appearing.  HENT:     Head: Normocephalic and atraumatic.  Eyes:     General: No scleral icterus.    Conjunctiva/sclera: Conjunctivae normal.  Cardiovascular:     Rate and Rhythm: Normal rate and regular rhythm.     Pulses: Normal pulses.     Heart sounds: Normal heart sounds.  Pulmonary:     Effort: Pulmonary effort is normal. No respiratory distress.     Breath sounds: Normal breath sounds.     Comments: TTP over lower central chest wall. No rash.  Chest:     Chest wall: Tenderness present.  Abdominal:     General: Abdomen is flat. Bowel sounds are normal. There is no distension.     Palpations: Abdomen is soft. There is no mass.     Tenderness: There is no abdominal tenderness.  Musculoskeletal:     Right lower leg: No edema.     Left lower leg: No edema.  Skin:    General: Skin is warm and dry.     Findings: No lesion.  Neurological:     General: No focal deficit present.  Mental Status: He is alert and oriented to person, place, and time. Mental status is at baseline.     (all labs ordered are listed, but only abnormal results are displayed) Labs Reviewed  BASIC METABOLIC PANEL WITH GFR - Abnormal; Notable for the following components:      Result Value   Glucose, Bld 140 (*)    All other components within normal limits  CBC WITH DIFFERENTIAL/PLATELET - Abnormal; Notable for the following components:   WBC 11.2 (*)    Neutro Abs 8.3 (*)    Abs Immature Granulocytes 0.08 (*)    All other components within normal limits  TROPONIN T, HIGH SENSITIVITY  TROPONIN T, HIGH SENSITIVITY    EKG: None  Radiology: DG Chest 2 View Result Date: 08/12/2024 CLINICAL DATA:  Chest pain EXAM: CHEST - 2 VIEW COMPARISON:  January 08, 2016 FINDINGS: The heart size and mediastinal contours are within normal limits. Both lungs are  clear. The visualized skeletal structures are unremarkable. IMPRESSION: No active cardiopulmonary disease. Electronically Signed   By: Lynwood Landy Raddle M.D.   On: 08/12/2024 09:46     Procedures   Medications Ordered in the ED - No data to display                                  Medical Decision Making  This patient presents to the ED for concern of chest pain, this involves an extensive number of treatment options, and is a complaint that carries with it a high risk of complications and morbidity.  The differential diagnosis includes ACS, stable angina, CHF, pneumonia, pneumothorax, aortic dissection, pulmonary embolus    Co morbidities that complicate the patient evaluation  DM2, HLD, obesity    Additional history obtained:  Additional history obtained from OV on 03/05/24    Lab Tests:  I personally interpreted labs.  The pertinent results include:   CBC with mild leukocytosis, no anemia BMP no acute findings.  Troponin negative x2 PERC negative.     Imaging Studies ordered:  I ordered imaging studies including chest x-ray I independently visualized and interpreted imaging which showed no acute findings.  I agree with the radiologist interpretation   Cardiac Monitoring: / EKG:  The patient was maintained on a cardiac monitor.  I personally viewed and interpreted the cardiac monitored which showed an underlying rhythm of: normal sinus rhythm    Problem List / ED Course / Critical interventions / Medication management   Patient presents to emergency room with complaint of chest pain.  Patient reports that this started at rest and lasted for approximately 2 hours.  He had some nausea when this first started as well but no vomiting.  He denies any shortness of breath, fever, cough, abdominal pain or diarrhea.  His EKG shows normal sinus rhythm and troponins x 2 negative thus doubt acute coronary syndrome at this time.  He is PERC negative thus feel PE is less likely.  No  evidence of pneumonia or fluid overload on chest x-ray.  Symptoms are not consistent with aortic dissection.  Pulse is equal in both extremities. ON exam he does have mild chest wall tenderness, this is reproducible on exam.  I have reviewed the patients home medicines and have made adjustments as needed. Patient's vitals are stable.  I will refer to cardiology for close follow up.  He will start daily ASA. He was given strict return precautions and  follow-up instructions.      Final diagnoses:  Chest pain, unspecified type    ED Discharge Orders          Ordered    Ambulatory referral to Cardiology       Comments: If you have not heard from the Cardiology office within the next 72 hours please call [phone].   08/12/24 1340               Chanze Teagle, Warren SAILOR, PA-C 08/12/24 1345    Neysa Caron PARAS, DO 08/12/24 1553  "

## 2024-08-12 NOTE — Discharge Instructions (Addendum)
 Your lab, EKG and chest x-ray are overall reassuring today. I have referred you to cardiology.  If you have not heard from them please call to schedule an appointment.  Please return to emergency room if you have any new or worsening symptoms.

## 2024-08-12 NOTE — ED Provider Triage Note (Signed)
 Emergency Medicine Provider Triage Evaluation Note  Tony Terrell , a 44 y.o. male  was evaluated in triage.  Pt complains of chest pain. Was at work, developed chest pain around 730 am . Reports some nausea, diaphoresis. No dyspnea. Pain mostly resolved after receiving asa   Review of Systems  Positive: Chest pain, nausea, diaphoresis  Negative: Vomiting, syncope, dyspnea   Physical Exam  BP 126/87   Pulse 84   Temp 97.7 F (36.5 C) (Oral)   Resp 18   Ht 6' (1.829 m)   Wt (!) 145.2 kg   SpO2 96%   BMI 43.40 kg/m  Gen:   Awake, no distress   Resp:  Normal effort  MSK:   Moves extremities without difficulty  Other:    Medical Decision Making  Medically screening exam initiated at 9:08 AM.  Appropriate orders placed.  Tony Terrell was informed that the remainder of the evaluation will be completed by another provider, this initial triage assessment does not replace that evaluation, and the importance of remaining in the ED until their evaluation is complete.     Francesca Elsie LITTIE, MD 08/12/24 [phone]

## 2024-08-12 NOTE — ED Triage Notes (Addendum)
 Pt arrives via POV. Pt reports he began having chest pain this morning around 0730 while at work. Pt states he became pale and diaphoretic at that time. denies any other associated symptoms. The nurse at his work administered 325mg  of aspirin. PT arrives AxOx4.

## 2024-08-13 ENCOUNTER — Encounter: Payer: Self-pay | Admitting: Cardiology

## 2024-08-13 ENCOUNTER — Ambulatory Visit: Attending: Cardiology | Admitting: Cardiology

## 2024-08-13 VITALS — BP 130/82 | HR 87 | Ht 72.0 in | Wt 317.2 lb

## 2024-08-13 DIAGNOSIS — E119 Type 2 diabetes mellitus without complications: Secondary | ICD-10-CM | POA: Diagnosis not present

## 2024-08-13 DIAGNOSIS — Z720 Tobacco use: Secondary | ICD-10-CM | POA: Diagnosis not present

## 2024-08-13 DIAGNOSIS — R072 Precordial pain: Secondary | ICD-10-CM | POA: Diagnosis not present

## 2024-08-13 MED ORDER — METOPROLOL TARTRATE 100 MG PO TABS: 100.0000 mg | ORAL_TABLET | Freq: Once | ORAL | 0 refills | Status: DC

## 2024-08-13 NOTE — Progress Notes (Signed)
 " Cardiology Office Note   Date:  08/13/2024  ID:  Tony Terrell, DOB [birth year], MRN [id number] PCP: Terry Wilhelmena Lloyd Hilario, FNP  [facility] Providers Cardiologist:  Alvan Carrier, MD    History of Present Illness Tony Terrell is a 44 y.o. male with a past medical history of type 2 DM, HLD, fatigue. Presents today for evaluation of chest pain.   Patient seen in the ED on 08/12/24 complaint of chest pain. Pain had started when he was at rest. In the ED, hsTn negative x2. CXR showed no active cardiopulmonary disease. EKG showed NSR, no ischemic changes. On exam, patient had tenderness to palpation over the chest. He was referred to cardiology.   Today, patient presents for an ED follow up appointment. Tells me that yesterday at work, he was sitting down when he started having chest pain. Pain was located in the middle of his chest. Felt like someone had reached in his chest and grabbed onto something. He walked around a bit, and the chest pain eased off. After sitting back down, the chest pain returned. Radiated up to his neck and into the left side of his chest. He also felt a bit sweaty. Denies having any shortness of breath. He went to the nurse at work who gave him aspirin and called EMS. By the time EMS arrived, the chest pain had eased off but not resolved. HE was worked up in the ED. Continues to have mild chest pain. Pain is reproducible on palpation.   He has a history of asthma which has been well controlled. Denies shortness of breath. Denies palpitations, syncope, near syncope, lower extremity swelling. He has a family history of CAD. His dad and a few of his paternal uncles have all had heart attacks. His PCP prescribed crestor , but he does not want to start it yet. His cholesterol has only been mildly elevated in the past. If coronary CTA shows an elevated calcium  score, he would be willing to start.     Studies Reviewed  Risk Assessment/Calculations           Physical  Exam VS:  BP 130/82   Pulse 87   Ht 6' (1.829 m)   Wt (!) 317 lb 3.2 oz (143.9 kg)   SpO2 97%   BMI 43.02 kg/m        Wt Readings from Last 3 Encounters:  08/13/24 (!) 317 lb 3.2 oz (143.9 kg)  08/12/24 (!) 320 lb (145.2 kg)  03/05/24 (!) 302 lb (137 kg)    GEN: Well nourished, well developed in no acute distress. Sitting comfortably on the exam table  NECK: No JVD  CARDIAC:  RRR, no murmurs, rubs, gallops. Radial pulses 2+ bilaterally  RESPIRATORY:  Clear to auscultation without rales, wheezing or rhonchi. Normal WOB on room air   ABDOMEN: Soft, non-tender, non-distended EXTREMITIES:  No edema in BLE ; No deformity   ASSESSMENT AND PLAN  Chest pain  - Patient seen in the ED yesterday with chest pain. EKG nonischemic. hsTn negative x2. CXR without active disease.  - Patient tells me that yesterday he had been sitting at work when he had chest pain. He got up to walk around, and pain resolved. He sat back down and pain returned along with diaphoresis, nausea. No SOB  - Patient continues to have mild chest pain. Reproducible on palpation  - Patient has family history of CAD on his father's side. He also has history of tobacco use, type 2  DM  - Ordered coronary CTA. Creatinine 0.83 yesterday  - Ordered echocardiogram  - Of note, LDL was 154 in 03/2024. PCP recommended starting crestor , but patient preferred to wait. Discussed that pending coronary CTA, may need to start statin and aspirin. He would be willing to if recommended after studies   Tobacco Use  - Counseled on tobacco cessation   Type 2 DM  - A1c 6.0 in 03/2024  - Continue ozempic      Dispo: Follow up with Dr. Alvan in [place] as requested   Signed, Rollo FABIENE Louder, PA-C   "

## 2024-08-13 NOTE — Patient Instructions (Signed)
 Medication Instructions:  Your physician recommends that you continue on your current medications as directed. Please refer to the Current Medication list given to you today.   *If you need a refill on your cardiac medications before your next appointment, please call your pharmacy*  Lab Work: None ordered  If you have labs (blood work) drawn today and your tests are completely normal, you will receive your results only by: MyChart Message (if you have MyChart) OR A paper copy in the mail If you have any lab test that is abnormal or we need to change your treatment, we will call you to review the results.  Testing/Procedures: Your physician has requested that you have an echocardiogram. Echocardiography is a painless test that uses sound waves to create images of your heart. It provides your doctor with information about the size and shape of your heart and how well your hearts chambers and valves are working. This procedure takes approximately one hour. There are no restrictions for this procedure. Please do NOT wear cologne, perfume, aftershave, or lotions (deodorant is allowed). Please arrive 15 minutes prior to your appointment time.  Please note: We ask at that you not bring children with you during ultrasound (echo/ vascular) testing. Due to room size and safety concerns, children are not allowed in the ultrasound rooms during exams. Our front office staff cannot provide observation of children in our lobby area while testing is being conducted. An adult accompanying a patient to their appointment will only be allowed in the ultrasound room at the discretion of the ultrasound technician under special circumstances. We apologize for any inconvenience.    Your physician has requested that you have cardiac CT. Cardiac computed tomography (CT) is a painless test that uses an x-ray machine to take clear, detailed pictures of your heart. For further information please visit  [email or web address] Please follow instruction sheet BELOW:    Your cardiac CT has been scheduled at    [facility] [address]  [place], [place] [address]  08/26/2024 ARRIVE AT 12:00     If scheduled at the Heart and Vascular Tower at [facility] street, please enter the parking lot using the [address] entrance and use the FREE valet service at the patient drop-off area. Enter the building and check-in with registration on the main floor.   Please follow these instructions carefully (unless otherwise directed):  An IV will be required for this test and Nitroglycerin will be given.  Hold all erectile dysfunction medications at least 3 days (72 hrs) prior to test. (Ie viagra , cialis, sildenafil , tadalafil, etc)   On the Night Before the Test: Be sure to Drink plenty of water. Do not consume any caffeinated/decaffeinated beverages or chocolate 12 hours prior to your test. Do not take any antihistamines 12 hours prior to your test.   On the Day of the Test: Drink plenty of water until 1 hour prior to the test. Do not eat any food 1 hour prior to test. You may take your regular medications prior to the test.  Take metoprolol  (Lopressor ) two hours prior to test. Patients who wear a continuous glucose monitor MUST remove the device prior to scanning.        After the Test: Drink plenty of water. After receiving IV contrast, you may experience a mild flushed feeling. This is normal. On occasion, you may experience a mild rash up to 24 hours after the test. This is not dangerous. If this occurs, you can  take Benadryl 25 mg, Zyrtec, Claritin, or Allegra and increase your fluid intake. (Patients taking Tikosyn should avoid Benadryl, and may take Zyrtec, Claritin, or Allegra) If you experience trouble breathing, this can be serious. If it is severe call 911 IMMEDIATELY. If it is mild, please call our office.  We will call to schedule your test 2-4  weeks out understanding that some insurance companies will need an authorization prior to the service being performed.   For more information and frequently asked questions, please visit our website : [email or web address]  For non-scheduling related questions, please contact the cardiac imaging nurse navigator should you have any questions/concerns: Cardiac Imaging Nurse Navigators Direct Office Dial: [phone]   For scheduling needs, including cancellations and rescheduling, please call Brittany, [phone].   Follow-Up: At [facility], you and your health needs are our priority.  As part of our continuing mission to provide you with exceptional heart care, our providers are all part of one team.  This team includes your primary Cardiologist (physician) and Advanced Practice Providers or APPs (Physician Assistants and Nurse Practitioners) who all work together to provide you with the care you need, when you need it.  Your next appointment:   1-2 WEEKS AFTER ECHOCARDIOGRAM  Provider:   Dorn Ross, MD    We recommend signing up for the patient portal called MyChart.  Sign up information is provided on this After Visit Summary.  MyChart is used to connect with patients for Virtual Visits (Telemedicine).  Patients are able to view lab/test results, encounter notes, upcoming appointments, etc.  Non-urgent messages can be sent to your provider as well.   To learn more about what you can do with MyChart, go to [email or web address].   Other Instructions

## 2024-08-25 ENCOUNTER — Encounter (HOSPITAL_COMMUNITY): Payer: Self-pay

## 2024-08-26 ENCOUNTER — Ambulatory Visit (HOSPITAL_COMMUNITY)
Admission: RE | Admit: 2024-08-26 | Discharge: 2024-08-26 | Disposition: A | Discharge status: Home / Self Care | Source: Ambulatory Visit | Attending: Cardiology | Admitting: Cardiology

## 2024-08-26 DIAGNOSIS — I2510 Atherosclerotic heart disease of native coronary artery without angina pectoris: Secondary | ICD-10-CM

## 2024-08-26 DIAGNOSIS — R072 Precordial pain: Secondary | ICD-10-CM | POA: Diagnosis present

## 2024-08-26 MED ORDER — METOPROLOL TARTRATE 5 MG/5ML IV SOLN
10.0000 mg | Freq: Once | INTRAVENOUS | Status: AC | PRN
  Administered 2024-08-26: 10 mg via INTRAVENOUS

## 2024-08-26 MED ORDER — IOHEXOL 350 MG/ML SOLN
100.0000 mL | Freq: Once | INTRAVENOUS | Status: AC | PRN
  Administered 2024-08-26: 100 mL via INTRAVENOUS

## 2024-08-26 MED ORDER — NITROGLYCERIN 0.4 MG SL SUBL
0.8000 mg | SUBLINGUAL_TABLET | Freq: Once | SUBLINGUAL | Status: AC
  Administered 2024-08-26: 0.8 mg via SUBLINGUAL

## 2024-08-26 MED ORDER — DILTIAZEM HCL 25 MG/5ML IV SOLN
10.0000 mg | INTRAVENOUS | Status: DC | PRN
  Administered 2024-08-26: 10 mg via INTRAVENOUS

## 2024-08-27 ENCOUNTER — Ambulatory Visit (HOSPITAL_COMMUNITY)
Admission: RE | Admit: 2024-08-27 | Discharge: 2024-08-27 | Disposition: A | Source: Ambulatory Visit | Attending: Cardiology

## 2024-08-27 ENCOUNTER — Other Ambulatory Visit (HOSPITAL_BASED_OUTPATIENT_CLINIC_OR_DEPARTMENT_OTHER): Payer: Self-pay | Admitting: Cardiology

## 2024-08-27 ENCOUNTER — Telehealth: Payer: Self-pay

## 2024-08-27 ENCOUNTER — Ambulatory Visit: Payer: Self-pay | Admitting: Cardiology

## 2024-08-27 DIAGNOSIS — R931 Abnormal findings on diagnostic imaging of heart and coronary circulation: Secondary | ICD-10-CM

## 2024-08-27 DIAGNOSIS — I2510 Atherosclerotic heart disease of native coronary artery without angina pectoris: Secondary | ICD-10-CM

## 2024-08-27 MED ORDER — NITROGLYCERIN 0.4 MG SL SUBL: 0.4000 mg | SUBLINGUAL_TABLET | SUBLINGUAL | 3 refills | Status: AC | PRN

## 2024-08-27 MED ORDER — ROSUVASTATIN CALCIUM 20 MG PO TABS: 20.0000 mg | ORAL_TABLET | Freq: Every day | ORAL | 3 refills | Status: AC

## 2024-08-27 NOTE — Telephone Encounter (Signed)
 Spoke with pt regarding cta. Patient has been scheduled for 09/03/2024 3:35 PM with Rana. Crestor  20mg  and nitroglycerin  has been sent to pt pharmacy. He is still having some chest pain but minimal. Understands to call ER response if needed. He has been instructed on how to take the nitroglycerin . He already has 81mg  on hand.

## 2024-09-02 NOTE — Progress Notes (Addendum)
 " Cardiology Office Note:  .   Date:  09/03/2024  ID:  Tony Terrell, DOB [birth year], MRN [id number] PCP: Terry Wilhelmena Lloyd Hilario, FNP  [facility] Providers Cardiologist:  Alvan Carrier, MD {  History of Present Illness: .   Tony Terrell is a 44 y.o. male with history of CAD noted on CCTA 08/2024, type 2 diabetes, hyperlipidemia, tobacco abuse.     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 Father with MI in his 82s and recently died.     Patient was seen in the ED 08/2024 with chest pain and negative troponins.  Reported reproducible pain with palpation.  Seen at follow-up and reported radiating pain to his neck and left side of his chest associated with sweatiness.  CCTA ordered and rosuvastatin  deferred until after results.  Results from the study demonstrated CAC score of 12.9.  However there was severe focal stenosis in the mid LCx, positive with FFR 0.75.  CAD RADS 4.  There was significant noise artifact which can lead to false positive results.  Echocardiogram not completed yet.   Today patient presents to discuss coronary CTA results and cardiac catheterization.  He reports that he has been having chest pain since March of last year but since his last visit with us  he has had accelerating, progressive chest pain with radiation to his left arm and neck associated with shortness of breath and severe fatigue.  Reports persistent chest pain both nonexertional/exertional around 4-5 out of 10 that he describes as a dull ache.  He thought he was having indigestion this whole time.  Reports pain currently and persistent pain almost all day.  He has used his nitroglycerin  at least 3 times since previous visit.  Pain is responsive to nitro.   Studies Reviewed: SABRA    EKG Interpretation Date/Time:  Friday September 03 2024 11:11:11 EST Ventricular Rate:  92 PR Interval:  118 QRS  Duration:  88 QT Interval:  332 QTC Calculation: 410 R Axis:   59  Text Interpretation: Normal sinus rhythm Normal ECG When compared with ECG of 12-Aug-2024 08:55, No significant change was found Confirmed by Darryle Currier [phone]) on 09/03/2024 11:15:50 AM    Risk Assessment/Calculations:        Physical Exam:   VS:  BP (!) 122/92   Pulse 92   Ht 6' (1.829 m)   Wt (!) 320 lb 3.2 oz (145.2 kg)   SpO2 99%   BMI 43.43 kg/m    Wt Readings from Last 3 Encounters:  09/03/24 (!) 320 lb 3.2 oz (145.2 kg)  08/13/24 (!) 317 lb 3.2 oz (143.9 kg)  08/12/24 (!) 320 lb (145.2 kg)    GEN: Well nourished, well developed in no acute distress NECK: No JVD; No carotid bruits CARDIAC: RRR, no murmurs, rubs, gallops RESPIRATORY:  Clear to auscultation without rales, wheezing or rhonchi  ABDOMEN: Soft, non-tender, non-distended EXTREMITIES:  No edema; No deformity   ASSESSMENT AND PLAN: .    Unstable angina CAD Hyperlipidemia - 08/2024 CCTA CAC score of 12.9.  Severe focal stenosis in the mid LCx, positive with FFR 0.75.  CAD RADS 4.  However there was significant noise artifact which can lead to false positive results. Patient's is reporting accelerating angina with chest pain radiation to his left arm and neck with persistent symptoms at rest and with exertion concerning for unstable angina.  Fortunately EKG without signs of ST elevation or other acute ST-T wave changes.  Reports pain currently.  We will plan for immediate evaluation at the ED.  He has elected to walk over there although EMS services was offered but our office is of very close proximity. Obtain stat troponins in the ED and start IV heparin . Recommend aspirin  load, increase rosuvastatin  to 40 mg.  Consider initiation of beta-blocker within 24 hours. Cath Lab has already been notified, we will plan for today.  Patient agreeable although very overwhelmed with everything and scared.  Tried to provide reassurance. Has pending  echocardiogram scheduled 2/11.  No signs of acute heart failure, can perform either outpatient or inpatient. No contrast allergy. Repeat lipid panel.  Type 2 diabetes A1c 6% he is on Ozempic .  Could consider SGLT2 inhibitor.  Tobacco abuse 1 pack/day for over 20+ years.  Should have lung cancer screening eventually.  Encouraged cessation, he is trying to quit currently at half a pack per day.  May need nicotine patch.     Informed Consent   Shared Decision Making/Informed Consent{  The risks [stroke (1 in 1000), death (1 in 1000), kidney failure [usually temporary] (1 in 500), bleeding (1 in 200), allergic reaction [possibly serious] (1 in 200)], benefits (diagnostic support and management of coronary artery disease) and alternatives of a cardiac catheterization were discussed in detail with [name] and he is willing to proceed.     Dispo: Follow-up with me after cardiac catheterization.  He is hoping to discharge relatively soon because his mother is depended on him if uncomplicated PCI may be discharged later today or tomorrow.  Case discussed with DOD Dr. Francyne and agreeable.  Signed, Thom LITTIE Sluder, PA-C  "

## 2024-09-03 ENCOUNTER — Other Ambulatory Visit: Payer: Self-pay

## 2024-09-03 ENCOUNTER — Emergency Department (HOSPITAL_COMMUNITY)

## 2024-09-03 ENCOUNTER — Encounter: Payer: Self-pay | Admitting: Cardiology

## 2024-09-03 ENCOUNTER — Encounter (HOSPITAL_COMMUNITY): Admission: EM | Disposition: A | Payer: Self-pay | Source: Home / Self Care

## 2024-09-03 ENCOUNTER — Ambulatory Visit: Attending: Cardiology | Admitting: Cardiology

## 2024-09-03 ENCOUNTER — Ambulatory Visit (HOSPITAL_COMMUNITY)
Admission: EM | Admit: 2024-09-03 | Discharge: 2024-09-04 | Disposition: A | Attending: Cardiology | Admitting: Cardiology

## 2024-09-03 ENCOUNTER — Ambulatory Visit: Admitting: Emergency Medicine

## 2024-09-03 ENCOUNTER — Other Ambulatory Visit (HOSPITAL_COMMUNITY): Payer: Self-pay

## 2024-09-03 VITALS — BP 122/92 | HR 92 | Ht 72.0 in | Wt 320.2 lb

## 2024-09-03 DIAGNOSIS — Z7985 Long-term (current) use of injectable non-insulin antidiabetic drugs: Secondary | ICD-10-CM | POA: Diagnosis not present

## 2024-09-03 DIAGNOSIS — R072 Precordial pain: Secondary | ICD-10-CM | POA: Diagnosis present

## 2024-09-03 DIAGNOSIS — I2510 Atherosclerotic heart disease of native coronary artery without angina pectoris: Secondary | ICD-10-CM

## 2024-09-03 DIAGNOSIS — E785 Hyperlipidemia, unspecified: Secondary | ICD-10-CM | POA: Diagnosis not present

## 2024-09-03 DIAGNOSIS — I25110 Atherosclerotic heart disease of native coronary artery with unstable angina pectoris: Secondary | ICD-10-CM | POA: Diagnosis not present

## 2024-09-03 DIAGNOSIS — Z8249 Family history of ischemic heart disease and other diseases of the circulatory system: Secondary | ICD-10-CM | POA: Insufficient documentation

## 2024-09-03 DIAGNOSIS — I200 Unstable angina: Secondary | ICD-10-CM

## 2024-09-03 DIAGNOSIS — R079 Chest pain, unspecified: Secondary | ICD-10-CM | POA: Diagnosis not present

## 2024-09-03 DIAGNOSIS — E119 Type 2 diabetes mellitus without complications: Secondary | ICD-10-CM | POA: Diagnosis not present

## 2024-09-03 DIAGNOSIS — F17210 Nicotine dependence, cigarettes, uncomplicated: Secondary | ICD-10-CM | POA: Diagnosis not present

## 2024-09-03 DIAGNOSIS — Z79899 Other long term (current) drug therapy: Secondary | ICD-10-CM | POA: Diagnosis not present

## 2024-09-03 LAB — CBC
HCT: 45.5 % (ref 39.0–52.0)
Hemoglobin: 15.1 g/dL (ref 13.0–17.0)
MCH: 31.8 pg (ref 26.0–34.0)
MCHC: 33.2 g/dL (ref 30.0–36.0)
MCV: 95.8 fL (ref 80.0–100.0)
Platelets: 339 10*3/uL (ref 150–400)
RBC: 4.75 MIL/uL (ref 4.22–5.81)
RDW: 13 % (ref 11.5–15.5)
WBC: 10 10*3/uL (ref 4.0–10.5)
nRBC: 0 % (ref 0.0–0.2)

## 2024-09-03 LAB — TROPONIN T, HIGH SENSITIVITY
Troponin T High Sensitivity: <6 ng/L (ref 0–19)
Troponin T High Sensitivity: <6 ng/L (ref 0–19)

## 2024-09-03 LAB — BASIC METABOLIC PANEL WITH GFR
Anion gap: 10 (ref 5–15)
BUN: 8 mg/dL (ref 6–20)
CO2: 26 mmol/L (ref 22–32)
Calcium: 9.5 mg/dL (ref 8.9–10.3)
Chloride: 103 mmol/L (ref 98–111)
Creatinine, Ser: 0.83 mg/dL (ref 0.61–1.24)
GFR, Estimated: >60 mL/min
Glucose, Bld: 88 mg/dL (ref 70–99)
Potassium: 4.6 mmol/L (ref 3.5–5.1)
Sodium: 140 mmol/L (ref 135–145)

## 2024-09-03 MED ORDER — HEPARIN SODIUM (PORCINE) 1000 UNIT/ML IJ SOLN
INTRAMUSCULAR | Status: AC
  Filled 2024-09-03: qty 10

## 2024-09-03 MED ORDER — FENTANYL CITRATE (PF) 100 MCG/2ML IJ SOLN
INTRAMUSCULAR | Status: AC
  Filled 2024-09-03: qty 2

## 2024-09-03 MED ORDER — MIDAZOLAM HCL (PF) 2 MG/2ML IJ SOLN
INTRAMUSCULAR | Status: DC | PRN
  Administered 2024-09-03: 2 mg via INTRAVENOUS

## 2024-09-03 MED ORDER — ASPIRIN 81 MG PO CHEW: 81.0000 mg | CHEWABLE_TABLET | ORAL | Status: DC

## 2024-09-03 MED ORDER — VERAPAMIL HCL 2.5 MG/ML IV SOLN
INTRAVENOUS | Status: AC
  Filled 2024-09-03: qty 2

## 2024-09-03 MED ORDER — LIDOCAINE HCL (PF) 1 % IJ SOLN
INTRAMUSCULAR | Status: DC | PRN
  Administered 2024-09-03: 2 mL

## 2024-09-03 MED ORDER — HYDRALAZINE HCL 20 MG/ML IJ SOLN: 10.0000 mg | INTRAMUSCULAR | Status: AC | PRN

## 2024-09-03 MED ORDER — SODIUM CHLORIDE 0.9 % IV SOLN: 250.0000 mL | INTRAVENOUS | Status: DC | PRN

## 2024-09-03 MED ORDER — ONDANSETRON HCL 4 MG/2ML IJ SOLN: 4.0000 mg | Freq: Four times a day (QID) | INTRAMUSCULAR | Status: DC | PRN

## 2024-09-03 MED ORDER — ROSUVASTATIN CALCIUM 20 MG PO TABS
20.0000 mg | ORAL_TABLET | Freq: Every day | ORAL | Status: DC
  Administered 2024-09-03 – 2024-09-04 (×2): 20 mg via ORAL
  Filled 2024-09-03 (×2): qty 1

## 2024-09-03 MED ORDER — ASPIRIN 81 MG PO CHEW
81.0000 mg | CHEWABLE_TABLET | ORAL | Status: AC
  Administered 2024-09-03: 81 mg via ORAL

## 2024-09-03 MED ORDER — HEPARIN SODIUM (PORCINE) 1000 UNIT/ML IJ SOLN
INTRAMUSCULAR | Status: DC | PRN
  Administered 2024-09-03 (×2): 6000 [IU] via INTRAVENOUS

## 2024-09-03 MED ORDER — FENTANYL CITRATE (PF) 100 MCG/2ML IJ SOLN
INTRAMUSCULAR | Status: DC | PRN
  Administered 2024-09-03: 25 ug via INTRAVENOUS

## 2024-09-03 MED ORDER — IOHEXOL 350 MG/ML SOLN
INTRAVENOUS | Status: DC | PRN
  Administered 2024-09-03: 80 mL

## 2024-09-03 MED ORDER — SODIUM CHLORIDE 0.9% FLUSH
3.0000 mL | Freq: Two times a day (BID) | INTRAVENOUS | Status: DC
  Administered 2024-09-04: 3 mL via INTRAVENOUS

## 2024-09-03 MED ORDER — LIDOCAINE HCL (PF) 1 % IJ SOLN
INTRAMUSCULAR | Status: AC
  Filled 2024-09-03: qty 30

## 2024-09-03 MED ORDER — ASPIRIN 81 MG PO CHEW
CHEWABLE_TABLET | ORAL | Status: AC
  Filled 2024-09-03: qty 1

## 2024-09-03 MED ORDER — SODIUM CHLORIDE 0.9% FLUSH
3.0000 mL | INTRAVENOUS | Status: DC | PRN
  Administered 2024-09-03: 3 mL via INTRAVENOUS

## 2024-09-03 MED ORDER — FREE WATER
500.0000 mL | Freq: Once | Status: AC
  Administered 2024-09-03: 500 mL via ORAL

## 2024-09-03 MED ORDER — VERAPAMIL HCL 2.5 MG/ML IV SOLN
INTRAVENOUS | Status: DC | PRN
  Administered 2024-09-03: 10 mL via INTRA_ARTERIAL

## 2024-09-03 MED ORDER — HEPARIN (PORCINE) IN NACL 1000-0.9 UT/500ML-% IV SOLN
INTRAVENOUS | Status: DC | PRN
  Administered 2024-09-03 (×2): 500 mL

## 2024-09-03 MED ORDER — ACETAMINOPHEN 325 MG PO TABS: 650.0000 mg | ORAL_TABLET | ORAL | Status: DC | PRN

## 2024-09-03 MED ORDER — FREE WATER: 500.0000 mL | Freq: Once | Status: AC

## 2024-09-03 MED ORDER — MIDAZOLAM HCL 2 MG/2ML IJ SOLN
INTRAMUSCULAR | Status: AC
  Filled 2024-09-03: qty 2

## 2024-09-03 NOTE — ED Triage Notes (Addendum)
 Pt sent here from cardiologist office for ongoing central chest pain that radiates to his shoulder. Cardiologist concerned for unstable angina with abnormal CT per pt note from doctors office.  Pt reports they mentioned scheduling him for a cardiac cath.

## 2024-09-03 NOTE — Interval H&P Note (Signed)
 History and Physical Interval Note:  09/03/2024 4:20 PM  DIMARCO MINKIN  has presented today for surgery, with the diagnosis of unstable angina.  The various methods of treatment have been discussed with the patient and family. After consideration of risks, benefits and other options for treatment, the patient has consented to  Procedures: LEFT HEART CATH AND CORONARY ANGIOGRAPHY (N/A) as a surgical intervention.  The patient's history has been reviewed, patient examined, no change in status, stable for surgery.  I have reviewed the patient's chart and labs.  Questions were answered to the patient's satisfaction.    Cath Lab Visit (complete for each Cath Lab visit)  Clinical Evaluation Leading to the Procedure:   ACS: Yes.    Non-ACS:    Anginal Classification: CCS III  Anti-ischemic medical therapy: No Therapy  Non-Invasive Test Results: Intermediate-risk stress test findings: cardiac mortality 1-3%/year  Prior CABG: No previous CABG       [facility] 09/03/2024 4:20 PM

## 2024-09-03 NOTE — ED Provider Triage Note (Signed)
 Emergency Medicine Provider Triage Evaluation Note  Tony Terrell , a 44 y.o. male  was evaluated in triage.  Pt complains of chest pain.  Reports that he has been having intermittent chest pain since March.  Pain is left-sided.  Does report for the past few weeks his pain has been more constant.  He had a cardiac CT that showed concerning findings with recommendation for a heart cath.  Reports that he saw his cardiologist today who recommended that he be admitted to the hospital for cardiac evaluation and likely inpatient cath.  Patient reports that his pain is the same and has not worsened in the last few weeks.  He does have intermittent intervals of more severe pain, however reports they are random and not triggered by exertion or relieved with rest.  Review of Systems  Positive:  Negative:   Physical Exam  BP (!) 143/96   Pulse 98   Temp 97.8 F (36.6 C)   Resp 18   Ht 6' (1.829 m)   Wt (!) 145.2 kg   SpO2 99%   BMI 43.40 kg/m  Gen:   Awake, no distress   Resp:  Normal effort MSK:   Moves extremities without difficulty  Other:    Medical Decision Making  Medically screening exam initiated at 1:22 PM.  Appropriate orders placed.  Tony Terrell was informed that the remainder of the evaluation will be completed by another provider, this initial triage assessment does not replace that evaluation, and the importance of remaining in the ED until their evaluation is complete.     Nora Lauraine LABOR, PA-C 09/03/24 1325

## 2024-09-03 NOTE — ED Provider Notes (Signed)
 " [place] EMERGENCY DEPARTMENT AT [facility] Provider Note   CSN: [id number] Arrival date & time: 09/03/24  1152     Patient presents with: Chest Pain   Tony Terrell is a 44 y.o. male.   Patient with history of asthma, hyperlipidemia, diabetes presents today with complaints of chest pain. Reports that he has been having intermittent chest pain since March 2025.  Pain is left-sided. Reports that he has been seen in the ER previously and had normal work-up. Does report for the past few weeks his pain has been more constant.  He had a cardiac CT that showed concerning findings with recommendation for a heart cath.  Reports that he saw his cardiologist today who recommended that he be admitted to the hospital for cardiac evaluation and likely inpatient cath.  Patient reports that his pain is the same and has not worsened in the last few weeks.  He does have intermittent intervals of more severe pain, however reports they are random and not triggered by exertion or relieved with rest. Reports dull pain currently. Does report that his pain improves with nitroglycerin  and therefore he has been taking this regularly.   The history is provided by the patient. No language interpreter was used.  Chest Pain      Prior to Admission medications  Medication Sig Start Date End Date Taking? Authorizing Provider  albuterol  (VENTOLIN  HFA) 108 (90 Base) MCG/ACT inhaler Inhale 1 puff into the lungs every 6 (six) hours as needed for wheezing or shortness of breath. 03/05/24   Del Orbe Polanco, Iliana, FNP  Cholecalciferol (VITAMIN D3) 50 MCG (2000 UT) capsule Take 1 capsule (2,000 Units total) by mouth daily. 02/03/23   Del Orbe Polanco, Iliana, FNP  cyclobenzaprine  (FLEXERIL ) 5 MG tablet Take 1 tablet (5 mg total) by mouth 3 (three) times daily as needed for muscle spasms. 01/31/23   Del Wilhelmena Lloyd Sola, FNP  nitroGLYCERIN  (NITROSTAT ) 0.4 MG SL tablet Place 1 tablet (0.4 mg total) under the  tongue every 5 (five) minutes as needed for chest pain. 08/27/24 11/25/24  Vicci Rollo SAUNDERS, PA-C  rosuvastatin  (CRESTOR ) 20 MG tablet Take 1 tablet (20 mg total) by mouth daily. 08/27/24 11/25/24  Vicci Rollo SAUNDERS, PA-C  Semaglutide , 2 MG/DOSE, (OZEMPIC , 2 MG/DOSE,) 8 MG/3ML SOPN INJECT 2MG   SUBCUTANEOUSLY ONCE A WEEK 08/06/24   Bevely Doffing, FNP    Allergies: Patient has no known allergies.    Review of Systems  Cardiovascular:  Positive for chest pain.  All other systems reviewed and are negative.   Updated Vital Signs BP (!) 143/96   Pulse 98   Temp 97.8 F (36.6 C)   Resp 18   Ht 6' (1.829 m)   Wt (!) 145.2 kg   SpO2 99%   BMI 43.40 kg/m   Physical Exam Vitals and nursing note reviewed.  Constitutional:      General: He is not in acute distress.    Appearance: Normal appearance. He is normal weight. He is not ill-appearing, toxic-appearing or diaphoretic.  HENT:     Head: Normocephalic and atraumatic.  Cardiovascular:     Rate and Rhythm: Normal rate.  Pulmonary:     Effort: Pulmonary effort is normal. No respiratory distress.  Musculoskeletal:        General: Normal range of motion.     Cervical back: Normal range of motion.  Skin:    General: Skin is warm and dry.  Neurological:     General: No  focal deficit present.     Mental Status: He is alert.  Psychiatric:        Mood and Affect: Mood normal.        Behavior: Behavior normal.     (all labs ordered are listed, but only abnormal results are displayed) Labs Reviewed  BASIC METABOLIC PANEL WITH GFR  CBC  TROPONIN T, HIGH SENSITIVITY  TROPONIN T, HIGH SENSITIVITY    EKG: None  Radiology: DG Chest 2 View Result Date: 09/03/2024 EXAM: 2 VIEW(S) XRAY OF THE CHEST 09/03/2024 01:06:00 PM COMPARISON: 08/12/2024 CLINICAL HISTORY: Chest pain. FINDINGS: LUNGS AND PLEURA: No focal pulmonary opacity. No pleural effusion. No pneumothorax. HEART AND MEDIASTINUM: No acute abnormality of the cardiac and  mediastinal silhouettes. BONES AND SOFT TISSUES: Prominent multilevel thoracic osteophytosis. IMPRESSION: 1. No acute cardiopulmonary pathology. Electronically signed by: Lonni Necessary MD 09/03/2024 02:35 PM EST RP Workstation: [id number]     Procedures   Medications Ordered in the ED  aspirin  chewable tablet 81 mg (has no administration in time range)                                    Medical Decision Making Amount and/or Complexity of Data Reviewed Labs: ordered. Radiology: ordered.   This patient is a 44 y.o. male who presents to the ED for concern of chest pain, this involves an extensive number of treatment options, and is a complaint that carries with it a high risk of complications and morbidity. The emergent differential diagnosis prior to evaluation includes, but is not limited to,  ACS, pericarditis, myocarditis, aortic dissection, PE, pneumothorax, esophageal rupture, pneumonia, reflux/PUD, biliary disease, pancreatitis, costochondritis, anxiety  This is not an exhaustive differential.   Past Medical History / Co-morbidities / Social History:  has a past medical history of Asthma, Hyperlipidemia, and Type 2 diabetes mellitus (HCC).  Additional history: Chart reviewed. Pertinent results include: saw cardiology earlier today, sent for admission/heparin /heart cath  Had Ct coronary on 1/25 that showed   CAD-RADS 4: Severe stenosis. (70-99% or > 50% left main). Cardiac catheterization or CT FFR is recommended  Physical Exam: Physical exam performed. The pertinent findings include: no acute physical exam abnormalities  Lab Tests: I ordered, and personally interpreted labs.  The pertinent results include: No acute laboratory abnormalities   Imaging Studies: I ordered imaging studies including CXR. I independently visualized and interpreted imaging which showed NAD. I agree with the radiologist interpretation.   Cardiac Monitoring:  The patient was maintained on  a cardiac monitor.  My attending physician viewed and interpreted the cardiac monitored which showed an underlying rhythm of: sinus rhythm, no STEMI. I agree with this interpretation.   Disposition: After consideration of the diagnostic results and the patients response to treatment, I feel that patient will require admission per cardiology recommendations. Patient taken from the lobby to the cath lab.   Final diagnoses:  Unstable angina [facility])  Coronary artery disease involving native coronary artery of native heart without angina pectoris    ED Discharge Orders     None          Nora Lauraine DELENA DEVONNA 09/03/24 1558  "

## 2024-09-03 NOTE — Plan of Care (Signed)

## 2024-09-03 NOTE — H&P (Addendum)
 Cardiology H&P.   Date:  09/03/2024  ID:  Tony Terrell, DOB October 16, 1980, MRN [id number] PCP: Terry Wilhelmena Lloyd Hilario, FNP  [facility] Providers Cardiologist:  Alvan Carrier, MD   History of Present Illness: .   Tony Terrell is a 44 y.o. male with history of CAD noted on CCTA 08/2024, type 2 diabetes, hyperlipidemia, tobacco abuse.   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 Father with MI in his 69s and recently died.   Patient was seen in the ED 08/2024 with chest pain and negative troponins.  Reported reproducible pain with palpation.  Seen at follow-up and reported radiating pain to his neck and left side of his chest associated with sweatiness.  CCTA ordered and rosuvastatin  deferred until after results.  Results from the study demonstrated CAC score of 12.9.  However there was severe focal stenosis in the mid LCx, positive with FFR 0.75.  CAD RADS 4.  There was significant noise artifact which can lead to false positive results.  Echocardiogram not completed yet.    Today patient presents to discuss coronary CTA results and cardiac catheterization.  He reports that he has been having chest pain since March of last year but since his last visit with us  he has had accelerating, progressive chest pain with radiation to his left arm and neck associated with shortness of breath and severe fatigue.  Reports persistent chest pain both nonexertional/exertional around 4-5 out of 10 that he describes as a dull ache.  He thought he was having indigestion this whole time.  Reports pain currently and persistent pain almost all day.  He has used his nitroglycerin  at least 3 times since previous visit.  Pain is responsive to nitro.     Studies Reviewed: SABRA     EKG Interpretation Date/Time:                  Friday September 03 2024 11:11:11 EST Ventricular Rate:         92 PR Interval:                  118 QRS Duration:             88 QT Interval:                 332 QTC Calculation:410 R Axis:                         59   Text Interpretation:Normal sinus rhythm Normal ECG When compared with ECG of 12-Aug-2024 08:55, No significant change was found Confirmed by Darryle Currier [phone]) on 09/03/2024 11:15:50 AM     Risk Assessment/Calculations:         Physical Exam:   VS:  BP (!) 122/92   Pulse 92   Ht 6' (1.829 m)   Wt (!) 320 lb 3.2 oz (145.2 kg)   SpO2 99%   BMI 43.43 kg/m       Wt Readings from Last 3 Encounters:  09/03/24 (!) 320 lb 3.2 oz (145.2 kg)  08/13/24 (!) 317 lb 3.2 oz (143.9 kg)  08/12/24 (!) 320 lb (145.2 kg)    GEN: Well nourished, well developed in no acute distress NECK: No JVD; No carotid bruits CARDIAC: RRR, no murmurs, rubs, gallops RESPIRATORY:  Clear to auscultation without rales, wheezing or rhonchi  ABDOMEN: Soft,  non-tender, non-distended EXTREMITIES:  No edema; No deformity    ASSESSMENT AND PLAN: .     Unstable angina CAD Hyperlipidemia - 08/2024 CCTA CAC score of 12.9.  Severe focal stenosis in the mid LCx, positive with FFR 0.75.  CAD RADS 4.  However there was significant noise artifact which can lead to false positive results. Patient's is reporting accelerating angina with chest pain radiation to his left arm and neck with persistent symptoms at rest and with exertion concerning for unstable angina.  Fortunately EKG without signs of ST elevation or other acute ST-T wave changes.  Reports pain currently.  We will plan for immediate evaluation at the ED.  He has elected to walk over there although EMS services was offered but our office is of very close proximity. Obtain stat troponins in the ED and start IV heparin . Recommend aspirin  load, increase rosuvastatin  to 40 mg.  Consider initiation of beta-blocker within 24 hours. Cath Lab has already been notified, we will plan for today.  Patient agreeable although very overwhelmed with everything and  scared.  Tried to provide reassurance. Has pending echocardiogram scheduled 2/11.  No signs of acute heart failure, can perform either outpatient or inpatient. No contrast allergy. Repeat lipid panel.   Type 2 diabetes A1c 6% he is on Ozempic .  Could consider SGLT2 inhibitor.   Tobacco abuse 1 pack/day for over 20+ years.  Should have lung cancer screening eventually.  Encouraged cessation, he is trying to quit currently at half a pack per day.  May need nicotine patch.      Informed Consent Shared Decision Making/Informed Consent{   The risks [stroke (1 in 1000), death (1 in 1000), kidney failure [usually temporary] (1 in 500), bleeding (1 in 200), allergic reaction [possibly serious] (1 in 200)], benefits (diagnostic support and management of coronary artery disease) and alternatives of a cardiac catheterization were discussed in detail with [name] and he is willing to proceed.      Dispo: Follow-up with me after cardiac catheterization.  He is hoping to discharge relatively soon because his mother is depended on him if uncomplicated PCI may be discharged later today or tomorrow.  Case discussed with DOD Dr. Francyne and agreeable.  CODE STATUS:: Full code  I have seen and examined the patient along with Thom Sluder, PA .  I have reviewed the chart, notes and new data.  I agree with PA/NP's note.  Key new complaints: Symptoms strongly suggestive of unstable angina. Key examination changes: Morbidly obese, normal cardiovascular examination Key new findings / data: Known severe focal stenosis in the mid left circumflex coronary artery by recent coronary CT angiogram.  PLAN: Best next type is to proceed with coronary angiography and if appropriate, percutaneous revascularization.  Strongly recommend smoking cessation.  Treat metabolic disorder with diet/exercise/medication to achieve Target hemoglobin A1c less than 7% and LDL cholesterol less than 70.  Informed Consent   Shared  Decision Making/Informed Consent The risks [stroke (1 in 1000), death (1 in 1000), kidney failure [usually temporary] (1 in 500), bleeding (1 in 200), allergic reaction [possibly serious] (1 in 200)], benefits (diagnostic support and management of coronary artery disease) and alternatives of a cardiac catheterization were discussed in detail with Mr. Bevens and he is willing to proceed.      Jerel Francyne, MD, FACC [facility] [phone] 09/03/2024, 2:57 PM

## 2024-09-03 NOTE — Addendum Note (Signed)
 Addended byBETHA DARRYLE CURRIER on: 09/03/2024 11:59 AM   Modules accepted: Orders

## 2024-09-04 ENCOUNTER — Encounter (HOSPITAL_COMMUNITY): Payer: Self-pay | Admitting: Cardiology

## 2024-09-04 NOTE — Discharge Summary (Signed)
 " Discharge Summary   Patient ID: Tony Terrell MRN: [id number]; DOB: May 09, 1981  Admit date: 09/03/2024 Discharge date: 09/04/2024  PCP:  Terry Wilhelmena Lloyd Hilario, FNP   [facility] Providers Cardiologist:  Alvan Carrier, MD     Discharge Diagnoses  Principal Problem:   Chest pain, precordial Active Problems:   Unstable angina [facility])   Diagnostic Studies/Procedures   Left heart catheterization 09/03/2024:  Diagnostic Dominance: Right   Fluoro time: 5.5 (min) DAP: 54002 (mGycm2) Cumulative Air Kerma: 836 (mGy) yeah     Mid Cx lesion is 45% stenosed.   The left ventricular systolic function is normal.   LV end diastolic pressure is normal.   The left ventricular ejection fraction is 55-65% by visual estimate.   Nonobstructive CAD. Vessels appear normal except for focal mid LCx disease 45%. Normal RFR of 0.98 Normal LV function Normal LVEDP   Plan: risk factor modification. Consider other causes of chest pain. Given late hour will observe overnight.    _____________   History of Present Illness    Per admission H&P by Dr. Francyne 09/03/2024:  CAMDIN Terrell is a 44 y.o. male with history of CAD noted on CCTA 08/2024, type 2 diabetes, hyperlipidemia, tobacco abuse.   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 Father with MI in his 45s and recently died.   Patient was seen in the ED 08/2024 with chest pain and negative troponins.  Reported reproducible pain with palpation.  Seen at follow-up and reported radiating pain to his neck and left side of his chest associated with sweatiness.  CCTA ordered and rosuvastatin  deferred until after results.  Results from the study demonstrated CAC score of 12.9.  However there was severe focal stenosis in the mid LCx, positive with FFR 0.75.  CAD RADS 4.  There was significant noise artifact which can lead to false positive  results.  Echocardiogram not completed yet.    09/03/24 patient presented to the office to discuss coronary CTA results and cardiac catheterization.  He reported that he has been having chest pain since March of last year but since his last visit with us  he has had accelerating, progressive chest pain with radiation to his left arm and neck associated with shortness of breath and severe fatigue.  Reported persistent chest pain both nonexertional/exertional around 4-5 out of 10 that he describes as a dull ache.  He thought he was having indigestion this whole time.  Reported pain currently and persistent pain almost all day.  He has used his nitroglycerin  at least 3 times since previous visit.  Pain was responsive to nitro.  He was directed to the ER for immediate evaluation with concern of unstable angina.      Hospital Course   Consultants: N/A   Nonobstructive CAD -Presented 09/03/2024 for accelerating chest pain but relieved by nitroglycerin , CCTA from 08/2024 revealed significant focal stenosis in LCx with possible artifact limiting view; he was directed from the office to ER for immediate evaluation -High sensitive troponin x 2 negative -EKG revealed no acute finding -Left heart cath 09/03/2024 revealed 45% mid circumflex lesion RFR 0.98, normal LV systolic function, normal LVEDP, LVEF estimate 55 to 65% - Outpatient Echo pending from 08/13/24  -Patient was observed overnight and felt ready for discharge today -Post-cath care discussed in detail via phone  -He already has follow up appt with Dr Alvan on  10/06/24, will keep (no sooner appt available)  HLD - Lipid panel from 03/05/2024 revealed LDL 154 and total cholesterol 211 -Continue PTA Crestor  20 mg daily - will need update of lipid panel at next appointment   Type 2 DM  - Hgb A1C 6% on 03/05/24 - Continue management per primary care    Tobacco abuse -Patient has been counseled on smoking cessation   Did the patient have an acute  coronary syndrome (MI, NSTEMI, STEMI, etc) this admission?:  No                               Did the patient have a percutaneous coronary intervention (stent / angioplasty)?:  No.          _____________  Discharge Vitals Blood pressure 120/81, pulse (!) 102, temperature 97.6 F (36.4 C), temperature source Oral, resp. rate 16, height 6' (1.829 m), weight (!) 145.3 kg, SpO2 99%.  Filed Weights   09/03/24 1241 09/03/24 1800  Weight: (!) 145.2 kg (!) 145.3 kg   See attending progress note for physical exam     Labs & Radiologic Studies  CBC Recent Labs    09/03/24 1244  WBC 10.0  HGB 15.1  HCT 45.5  MCV 95.8  PLT 339   Basic Metabolic Panel Recent Labs    98/69/73 1244  NA 140  K 4.6  CL 103  CO2 26  GLUCOSE 88  BUN 8  CREATININE 0.83  CALCIUM  9.5   Liver Function Tests No results for input(s): AST, ALT, ALKPHOS, BILITOT, PROT, ALBUMIN in the last 72 hours. No results for input(s): LIPASE, AMYLASE in the last 72 hours. High Sensitivity Troponin:   No results for input(s): TROPONINIHS in the last 720 hours.  Recent Labs  Lab 08/12/24 0916 08/12/24 1114 09/03/24 1244 09/03/24 1937  TRNPT <15 <15 <6 <6    BNP Invalid input(s): POCBNP No results for input(s): PROBNP in the last 72 hours.  No results for input(s): BNP in the last 72 hours.  D-Dimer No results for input(s): DDIMER in the last 72 hours. Hemoglobin A1C No results for input(s): HGBA1C in the last 72 hours. Fasting Lipid Panel No results for input(s): CHOL, HDL, LDLCALC, TRIG, CHOLHDL, LDLDIRECT in the last 72 hours. No results found for: LIPOA  Thyroid Function Tests No results for input(s): TSH, T4TOTAL, T3FREE, THYROIDAB in the last 72 hours.  Invalid input(s): FREET3 _____________  CARDIAC CATHETERIZATION Result Date: 09/03/2024   Mid Cx lesion is 45% stenosed.   The left ventricular systolic function is normal.   LV end diastolic  pressure is normal.   The left ventricular ejection fraction is 55-65% by visual estimate. Nonobstructive CAD. Vessels appear normal except for focal mid LCx disease 45%. Normal RFR of 0.98 Normal LV function Normal LVEDP Plan: risk factor modification. Consider other causes of chest pain. Given late hour will observe overnight.   DG Chest 2 View Result Date: 09/03/2024 EXAM: 2 VIEW(S) XRAY OF THE CHEST 09/03/2024 01:06:00 PM COMPARISON: 08/12/2024 CLINICAL HISTORY: Chest pain. FINDINGS: LUNGS AND PLEURA: No focal pulmonary opacity. No pleural effusion. No pneumothorax. HEART AND MEDIASTINUM: No acute abnormality of the cardiac and mediastinal silhouettes. BONES AND SOFT TISSUES: Prominent multilevel thoracic osteophytosis. IMPRESSION: 1. No acute cardiopulmonary pathology. Electronically signed by: Lonni Necessary MD 09/03/2024 02:35 PM EST RP Workstation: [id number]   CT CORONARY MORPH W/CTA COR W/SCORE W/CA W/CM &/OR WO/CM Addendum Date: 08/29/2024 ADDENDUM REPORT:  08/29/2024 18:00 CLINICAL DATA:  This over-read does not include interpretation of cardiac or coronary anatomy or pathology. The coronary CTA interpretation by the cardiologist is attached. COMPARISON:  None available. FINDINGS: No suspicious nodules, masses, or infiltrates are identified in the visualized portion of the lungs. No pleural fluid seen. The visualized portions of the mediastinum and hilar regions are unremarkable. IMPRESSION: No significant non-cardiac abnormality identified. Electronically Signed   By: Norleen DELENA Kil M.D.   On: 08/29/2024 18:00   Result Date: 08/29/2024 CLINICAL DATA:  Chest pain, nonspecific EXAM: Cardiac/Coronary CTA TECHNIQUE: A non-contrast, gated CT scan was obtained with axial slices of 2.5 mm through the heart for calcium  scoring. Calcium  scoring was performed using the Agatston method. A 120 kV prospective, gated, contrast cardiac CT scan was obtained. Gantry rotation speed was 230 msec and collimation  was 0.63 mm. Two sublingual nitroglycerin  tablets (0.8 mg) were given. The 3D data set was reconstructed with motion correction for the best systolic or diastolic phase. Images were analyzed on a dedicated workstation using MPR, MIP, and VRT modes. The patient received 100mL OMNIPAQUE  IOHEXOL  350 MG/ML SOLN cc of contrast. FINDINGS: Image quality: Average Noise artifact is: Significant Calcium  score: Calcium  score is 12.9, which is 84th percentile. Coronary Arteries:  Normal coronary origin.  Co- dominance. Left main: Normal caliber vessel. There is minimal noncalcified plaque with maximum 1-24% stenosis. Ramus intermedius: Normal caliber vessel. There is proximal focal noncalcified plaque with maximum 1-24% stenosis. 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 There is a 15 mm segment in the mid vessel with superficial myocardial bridging (depth less than 2 mm). The LAD gives off one large diagonal branch. Ostial D1 with focal calcified and noncalcified plaque with visual 50-69% stenosis. Left circumflex artery: Normal caliber, gives rise to the lPDA. There is focal noncalcified plaque in the proximal vessel visually concerning for 70-99% stenosis. The LCX gives off one small obtuse marginal branch. Right coronary artery: Normal caliber, gives rise to the rPDA. Proximal noncalcified plaque with 1-24% stenosis. Right Atrium: Right atrial size is visually normal. Right Ventricle: The right ventricular cavity is visually normal. Left Atrium: Left atrial size is visually normal with no left atrial appendage filling defect. Lipomatous intra-atrial septum. Left Ventricle: The ventricular cavity size is visually normal. Pulmonary arteries: Borderline dilated size (measured 29.3 x 25.9 mm). Pulmonary veins: Normal pulmonary venous drainage. Pericardium: Normal  thickness without significant effusion or calcium  present. Cardiac valves: The aortic valve is trileaflet without significant calcification. The mitral valve is normal without significant calcification. Aorta: Normal caliber without significant disease. Extra-cardiac findings: See attached radiology report for non-cardiac structures. IMPRESSION: 1. Focal severe stenosis in mid LCx, CADRADS=4. CT-FFR will be performed and reported separately. 2. Coronary calcium  score of 12.9. This was 84th percentile for age-, sex, and race-matched controls. 3. Normal coronary origin with co- dominance. 4.  Borderline dilated size of pulmonary artery as above. 5. Signal to noise artifact is significant, which can lead to false positive results. RECOMMENDATIONS: CAD-RADS 4: Severe stenosis. (70-99% or > 50% left main). Cardiac catheterization or CT FFR is recommended. Consider symptom-guided anti-ischemic pharmacotherapy as well as risk factor modification per guideline directed care. Shelda Bruckner, MD Electronically Signed: By: Shelda Bruckner M.D. On: 08/27/2024 15:14   CT CORONARY FFR DATA PREP & FLUID ANALYSIS Result Date: 08/27/2024 EXAM: CT FFR ANALYSIS CLINICAL DATA:  abnormal imaging of coronary circulation FINDINGS: FFRct analysis was performed on the original cardiac CT angiogram dataset. Diagrammatic representation of the FFRct analysis is provided in a separate PDF document in PACS. This dictation was created using the PDF document and an interactive 3D model of the results. 3D model is not available in the EMR/PACS. Normal FFR range is >0.80. 1. Left Main:  No significant stenosis. FFR = 0.98 2. LAD: No significant stenosis. Proximal FFR = 0.96, Mid FFR = 0.92, Distal FFR = 0.88. FFR is D1 negative at 0.85. 3. LCX: Significant focal stenosis in proximal vessel. Proximal FFR = 0.98, mid FFR = 0.75, distal FFR = 0.71 4. RCA: No significant stenosis. Proximal FFR = 0.97, Mid FFR = 0.94, Distal FFR = 0.93  IMPRESSION: 1. CT FFR analysis suggests significant focal stenosis in proximal Lcx with FFR 0.75. 2. Study had significant signal to noise artifact, which can lead to false positive results Electronically Signed   By: Shelda Bruckner M.D.   On: 08/27/2024 15:17   DG Chest 2 View Result Date: 08/12/2024 CLINICAL DATA:  Chest pain EXAM: CHEST - 2 VIEW COMPARISON:  January 08, 2016 FINDINGS: The heart size and mediastinal contours are within normal limits. Both lungs are clear. The visualized skeletal structures are unremarkable. IMPRESSION: No active cardiopulmonary disease. Electronically Signed   By: Lynwood Landy Raddle M.D.   On: 08/12/2024 09:46    Disposition Patient is seen by Dr Sheena today, deemed stable for discharge.  Follow-up plan, post cath care, medication change had been reviewed over the phone with the patient.All questions answered.  Pt is being discharged home today in good condition.  Follow-up Plans & Appointments  Discharge Instructions     Discharge instructions   Complete by: As directed    Please follow up with [facility] office on 10/06/24 as scheduled    Radial Site Care  Refer to this sheet in the next few weeks. These instructions provide you with information on caring for yourself after your procedure. Your caregiver may also give you more specific instructions. Your treatment has been planned according to current medical practices, but problems sometimes occur. Call your caregiver if you have any problems or questions after your procedure.  HOME CARE INSTRUCTIONS You may shower the day after the procedure. Remove the bandage (dressing) and gently wash the site with plain soap and water . Gently pat the site dry.  Do not apply powder or lotion to the site.  Do not submerge the affected site in water  for 3 to 5 days.  Inspect the site at least twice daily.  Do not flex or bend the affected arm for 24 hours.  No lifting over 5 pounds (2.3 kg) for 5 days after your procedure.   Do not drive home if you are discharged the same day of the procedure. Have someone else drive you.  You may drive 24 hours after the procedure unless otherwise instructed by your caregiver.   What to expect: Any bruising will usually fade within 1 to 2 weeks.  Blood that collects in the tissue (hematoma) may be painful to the touch. It should usually decrease in size and tenderness within 1 to 2 weeks.   SEEK IMMEDIATE MEDICAL CARE IF: You have unusual pain at the radial site.  You have redness, warmth, swelling, or pain at the radial site.  You have drainage (other than a small amount of blood on the dressing).  You have chills.  You have a fever  or persistent symptoms for more than 72 hours.  You have a fever and your symptoms suddenly get worse.  Your arm becomes pale, cool, tingly, or numb.  You have heavy bleeding from the site. Hold pressure on the site.   Increase activity slowly   Complete by: As directed        Discharge Medications Allergies as of 09/04/2024   No Known Allergies      Medication List     STOP taking these medications    acetaminophen  500 MG tablet Commonly known as: TYLENOL        TAKE these medications    albuterol  108 (90 Base) MCG/ACT inhaler Commonly known as: VENTOLIN  HFA Inhale 1 puff into the lungs every 6 (six) hours as needed for wheezing or shortness of breath.   aspirin  EC 81 MG tablet Take 81 mg by mouth daily. Swallow whole. Take with Rosuvastatin  20mg .   cyclobenzaprine  5 MG tablet Commonly known as: FLEXERIL  Take 1 tablet (5 mg total) by mouth 3 (three) times daily as needed for muscle spasms.   nitroGLYCERIN  0.4 MG SL tablet Commonly known as: NITROSTAT  Place 1 tablet (0.4 mg total) under the tongue every 5 (five) minutes as needed for chest pain.   Ozempic  (2 MG/DOSE) 8 MG/3ML Sopn Generic drug: Semaglutide  (2 MG/DOSE) INJECT 2MG   SUBCUTANEOUSLY ONCE A WEEK What changed: See the new instructions.   rosuvastatin   20 MG tablet Commonly known as: CRESTOR  Take 1 tablet (20 mg total) by mouth daily. What changed: additional instructions   Vitamin D3 50 MCG (2000 UT) capsule Take 1 capsule (2,000 Units total) by mouth daily.         Outstanding Labs/Studies N/A  Duration of Discharge Encounter: APP Time: 30 minutes   Signed, Iylah Dworkin, NP 09/04/2024, 11:15 AM     "

## 2024-09-07 ENCOUNTER — Telehealth: Payer: Self-pay | Admitting: Cardiology

## 2024-09-07 NOTE — Telephone Encounter (Signed)
 Hi. Pt came into office req a back to work note with no restrictions. Please advise. Call pt when ready to pickup. Thank you.

## 2024-09-07 NOTE — Telephone Encounter (Signed)
"    Informed patient  , neither  Dr Jordan or Thom PA  are here in the office to okay work note    Patient had a  cardiac cath 09/03/24. Results :   Nonobstructive CAD. Vessels appear normal except for focal mid LCx disease 45%. Normal RFR of 0.98 Normal LV function Normal LVEDP   Plan: risk factor modification. Consider other causes of chest pain. Given late hour will observe overnight.     RN   reviewed  the question with DOD- Dr Sheena.   Per Dr Sheena , please have patient to write a comment  to mychart  of what type of work he does?  "

## 2024-09-07 NOTE — Telephone Encounter (Signed)
 Patient received the not needed

## 2024-09-08 LAB — LIPOPROTEIN A (LPA): Lipoprotein (a): 26.4 nmol/L

## 2024-09-15 ENCOUNTER — Ambulatory Visit (HOSPITAL_COMMUNITY)

## 2024-09-24 ENCOUNTER — Ambulatory Visit

## 2024-10-06 ENCOUNTER — Ambulatory Visit: Admitting: Cardiology
